# Patient Record
Sex: Male | Born: 1970 | Race: White | Hispanic: No | Marital: Single | Smoking: Never smoker
Health system: Southern US, Community
[De-identification: ages and names within clinical notes are randomized; demographics above are authoritative.]

## PROBLEM LIST (undated history)

## (undated) DIAGNOSIS — K219 Gastro-esophageal reflux disease without esophagitis: Secondary | ICD-10-CM

## (undated) DIAGNOSIS — R55 Syncope and collapse: Secondary | ICD-10-CM

## (undated) DIAGNOSIS — K449 Diaphragmatic hernia without obstruction or gangrene: Secondary | ICD-10-CM

## (undated) DIAGNOSIS — I10 Essential (primary) hypertension: Secondary | ICD-10-CM

## (undated) HISTORY — DX: Syncope and collapse: R55

## (undated) HISTORY — DX: Essential (primary) hypertension: I10

## (undated) HISTORY — PX: APPENDECTOMY: SHX54

## (undated) HISTORY — DX: Diaphragmatic hernia without obstruction or gangrene: K44.9

## (undated) HISTORY — DX: Gastro-esophageal reflux disease without esophagitis: K21.9

---

## 2004-11-30 ENCOUNTER — Encounter: Payer: Self-pay | Admitting: Family Medicine

## 2008-09-23 ENCOUNTER — Ambulatory Visit: Payer: Self-pay | Admitting: Family Medicine

## 2008-09-23 DIAGNOSIS — K219 Gastro-esophageal reflux disease without esophagitis: Secondary | ICD-10-CM | POA: Insufficient documentation

## 2008-09-23 DIAGNOSIS — K449 Diaphragmatic hernia without obstruction or gangrene: Secondary | ICD-10-CM | POA: Insufficient documentation

## 2008-09-23 DIAGNOSIS — J45909 Unspecified asthma, uncomplicated: Secondary | ICD-10-CM | POA: Insufficient documentation

## 2009-09-07 ENCOUNTER — Ambulatory Visit: Payer: Self-pay | Admitting: Family Medicine

## 2009-09-07 DIAGNOSIS — I10 Essential (primary) hypertension: Secondary | ICD-10-CM | POA: Insufficient documentation

## 2009-09-07 HISTORY — DX: Essential (primary) hypertension: I10

## 2010-07-31 ENCOUNTER — Ambulatory Visit: Payer: Self-pay | Admitting: Family Medicine

## 2010-08-22 ENCOUNTER — Ambulatory Visit
Admission: RE | Admit: 2010-08-22 | Discharge: 2010-08-22 | Payer: Self-pay | Source: Home / Self Care | Attending: Family Medicine | Admitting: Family Medicine

## 2010-09-25 NOTE — Assessment & Plan Note (Signed)
Summary: cough/med refill/rbh   Vital Signs:  Patient profile:   40 year old male Height:      68.5 inches Weight:      187.8 pounds BMI:     28.24 Temp:     97.9 degrees F oral Pulse rate:   60 / minute Pulse rhythm:   regular BP sitting:   140 / 94  (left arm) Cuff size:   regular  Vitals Entered By: Benny Lennert CMA Duncan Dull) (September 07, 2009 9:44 AM)  History of Present Illness: Chief complaint cough and medication refills  40 year old:  BP elevated: blood pressure is elevated today, typically does not. He is acutely sick and coughing  Cough: status post URI, he is having some continued cough, patient does have a remote history of mild asthma. He has noticed some occasional wheezing, and he does not have an albuterol inhaler currently. He is in no respiratory distress but does have a dry hacking cough.  GERD: stable and protonic, needs refills. Failed multiple other medications.  Got a bad cold. and  Has had some mild wheezing.     Allergies (verified): No Known Drug Allergies  Past History:  Past medical, surgical, family and social histories (including risk factors) reviewed, and no changes noted (except as noted below).  Past Medical History: Reviewed history from 09/23/2008 and no changes required. Asthma GERD Hiatal hernia  Past Surgical History: Reviewed history from 10/31/2008 and no changes required. Appendectomy  MRI/MRA head, Neg, 07/16/06 EGD: Gastritis, esophagitis, 5/06 Exercise echo, normal, 5/06  Family History: Reviewed history from 09/23/2008 and no changes required. M, alive F, alive, HTN Colon CA, GF, 83  Social History: Reviewed history from 09/23/2008 and no changes required. 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 CV: No chest pain or SOB GI: No noted N or V Otherwise, pertinent positives  and negatives are noted in the HPI.   Physical Exam  Additional Exam:  GEN: A and O x 3. WDWN. NAD.    ENT: Nose clear, ext NML.  No LAD.  No JVD.  TM's clear. Oropharynx clear.  PULM: Normal WOB, no distress. No crackles, wheezes, rhonchi. CV: RRR, no M/G/R, No rubs, No JVD.   ABD: S, NT, ND, + BS. No rebound. No guarding. No HSM.   EXT: warm and well-perfused, No c/c/e. PSYCH: Pleasant and conversant.    Impression & Recommendations:  Problem # 1:  BRONCHITIS- ACUTE (ICD-466.0) Assessment New reason for cover for atypical infection in this case with an asthmatic. Additionally, Ventolin as needed. Cough medicines and supportive care.  His updated medication list for this problem includes:    Ventolin Hfa 108 (90 Base) Mcg/act Aers (Albuterol sulfate) .Marland Kitchen... 2 puff q 4 hour as needed cough / wheezing    Azithromycin 250 Mg Tabs (Azithromycin) .Marland Kitchen... 2 by  mouth today and then 1 daily for 4 days  Problem # 2:  ASTHMA (ICD-493.90) Assessment: Deteriorated  His updated medication list for this problem includes:    Ventolin Hfa 108 (90 Base) Mcg/act Aers (Albuterol sulfate) .Marland Kitchen... 2 puff q 4 hour as needed cough / wheezing  Problem # 3:  GERD (ICD-530.81) Assessment: Unchanged  The following medications were removed from the medication list:    Protonix 40 Mg Tbec (Pantoprazole sodium) .Marland Kitchen... 1 by mouth daily His updated medication list for this problem  includes:    Protonix 40 Mg Tbec (Pantoprazole sodium) .Marland Kitchen... Take one tablet once daily (failed prilosec, aciphex)  Problem # 4:  ELEVATED BLOOD PRESSURE (ICD-796.2) Assessment: New at this point, would follow, as the patient to recheck when he is healthy.  Complete Medication List: 1)  Protonix 40 Mg Tbec (Pantoprazole sodium) .... Take one tablet once daily (failed prilosec, aciphex) 2)  Ventolin Hfa 108 (90 Base) Mcg/act Aers (Albuterol sulfate) .... 2 puff q 4 hour as needed cough / wheezing 3)  Azithromycin 250 Mg Tabs  (Azithromycin) .... 2 by  mouth today and then 1 daily for 4 days Prescriptions: PROTONIX 40 MG TBEC (PANTOPRAZOLE SODIUM) take one tablet once daily (failed Prilosec, Aciphex)  #90 x 3   Entered and Authorized by:   Hannah Beat MD   Signed by:   Hannah Beat MD on 09/07/2009   Method used:   Print then Give to Patient   RxID:   [id number] AZITHROMYCIN 250 MG  TABS (AZITHROMYCIN) 2 by  mouth today and then 1 daily for 4 days  #6 x 0   Entered and Authorized by:   Hannah Beat MD   Signed by:   Hannah Beat MD on 09/07/2009   Method used:   [facility]        [facility] [id number]       [address]       [place], [place]  [address]       Ph: [phone] or [phone]       Fax: [phone]   RxID:   [id number] VENTOLIN HFA 108 (90 BASE) MCG/ACT AERS (ALBUTEROL SULFATE) 2 puff q 4 hour as needed cough / wheezing  #1 x 3   Entered and Authorized by:   Hannah Beat MD   Signed by:   Hannah Beat MD on 09/07/2009   Method used:   [facility]        [facility] (retail)       [address]       [place], [place]  [address]       Ph: [phone] or [phone]       Fax: [phone]   RxID:   [id number]   Current Allergies (reviewed today): No known allergies

## 2010-09-25 NOTE — Assessment & Plan Note (Signed)
Summary: sinus infection/alc   Vital Signs:  Patient profile:   40 year old male Weight:      186 pounds BMI:     27.97 O2 Sat:      97 % on Room air Temp:     98.7 degrees F oral Pulse rate:   80 / minute Pulse rhythm:   regular BP sitting:   136 / 90  (left arm) Cuff size:   large  Vitals Entered By: Sydell Axon LPN (July 31, 2010 2:03 PM)  O2 Flow:  Room air CC: Getting over a cold, dry cough   History of Present Illness: Had cold a few weeks ago.  Cough persists after the cold- this is typical for patient.  Out of inhalers.  Needs new meds.  Dry cough.  H/o asthma that only flares after URI. No FCNAVD.  no sputum.  Wheeze, worse at night.  Prev on flovent and proventil.  Nonsmoker.    Allergies (verified): 1)  ! * Bee Sting  Past History:  Past Medical History: Last updated: 09/23/2008 Asthma GERD Hiatal hernia  Review of Systems       See HPI.  Otherwise negative.    Physical Exam  General:  GEN: nad, alert and oriented HEENT: mucous membranes moist, TM wnl, OP wnl NECK: supple w/o LA CV: rrr. PULM: dry cough but ctab, no inc wob ABD: soft, +bs EXT: no edema    Impression & Recommendations:  Problem # 1:  ASTHMA (ICD-493.90) flu shot encouraged, declined.  Likely flare after URI.  Start back on inhaled SABA and steroids.  Both given as samples.  Both used here in clinic with usualy instructions, good form with use.  Dec in cough as SABA.  Told patient to rinse mouth after steroids.  Will use meds in meantime and follow up as needed.  He may not need chronic meds for this.  He agrees with plan.  His updated medication list for this problem includes:    Ventolin Hfa 108 (90 Base) Mcg/act Aers (Albuterol sulfate) .Marland Kitchen... 2 puff q 4 hour as needed cough / wheezing    Flovent Diskus 100 Mcg/blist Aepb (Fluticasone propionate (inhal)) .Marland Kitchen... 1 puff two times a day  Complete Medication List: 1)  Protonix 40 Mg Tbec (Pantoprazole sodium) .... Take one tablet  once daily (failed prilosec, aciphex) 2)  Ventolin Hfa 108 (90 Base) Mcg/act Aers (Albuterol sulfate) .... 2 puff q 4 hour as needed cough / wheezing 3)  Flovent Diskus 100 Mcg/blist Aepb (Fluticasone propionate (inhal)) .Marland Kitchen.. 1 puff two times a day 4)  Epipen 2-pak 0.3 Mg/0.43ml Devi (Epinephrine) .... Inject as directed if stung by bee then proceed to er  Patient Instructions: 1)  I sent your epipen to the pharmacy.  Use the orange inhaler (flovent) 1 puff two times a day and rinse your mouth after using.  Use the blue ventolin 2 puffs every 4 hours as needed for cough and wheeze.  I would get a flu shot each fall.  If you have other concerns, let us know.  Take care.  Prescriptions: EPIPEN 2-PAK 0.3 MG/0.3ML DEVI (EPINEPHRINE) inject as directed if stung by bee then proceed to ER  #1 pack x 1   Entered and Authorized by:   Crawford Givens MD   Signed by:   Crawford Givens MD on 07/31/2010   Method used:   [facility]        [facility] [phone]  [address]       [place], [place]  [address]       Ph: [phone] or [phone]       Fax: [phone]   RxID:   [id number]    Orders Added: 1)  Est. Patient Level III [[address]]    Current Allergies (reviewed today): ! * BEE STING

## 2010-09-27 NOTE — Assessment & Plan Note (Signed)
Summary: F/U COUGH/CLE   Vital Signs:  Patient profile:   40 year old male Height:      68.5 inches Weight:      183.50 pounds BMI:     27.59 Temp:     98.9 degrees F oral Pulse rate:   80 / minute Pulse rhythm:   regular BP sitting:   142 / 98  (left arm) Cuff size:   large  Vitals Entered By: Delilah Shan CMA Duncan Dull) (August 22, 2010 4:05 PM) CC: F/U cough   History of Present Illness: Some help with SABA- cough persisted but he felt "more open and with better breathing".  Still on the flovent.  He usually has a cough after the URI.  No smoking.  No FCNAVD.  No sputum.  Occ wheeze, variable and worse if inside and at night.  Does better on the days with warmer weather.  Prev winters have had similar episode that would eventually resolve in spring.   Allergies: 1)  ! * Bee Sting  Review of Systems       See HPI.  Otherwise negative.    Physical Exam  General:  GEN: nad, alert and oriented HEENT: mucous membranes moist, TM wnl, OP wnl NECK: supple w/o LA CV: rrr. PULM: dry cough but ctab, no inc wob ABD: soft, +bs EXT: no edema   Impression & Recommendations:  Problem # 1:  ASTHMA (ICD-493.90) We talked about options.  No indication for imaging.  I would increase the ICH and use SABA as needed- SABA sample given.  Call back if not improved and consider switch to ICH HFA.  He agrees.  Nontoxic.  His updated medication list for this problem includes:    Ventolin Hfa 108 (90 Base) Mcg/act Aers (Albuterol sulfate) .Marland Kitchen... 2 puff q 4 hour as needed cough / wheezing    Flovent Diskus 100 Mcg/blist Aepb (Fluticasone propionate (inhal)) .Marland Kitchen... 2 puffs two times a day  Complete Medication List: 1)  Protonix 40 Mg Tbec (Pantoprazole sodium) .... Take one tablet once daily (failed prilosec, aciphex) 2)  Ventolin Hfa 108 (90 Base) Mcg/act Aers (Albuterol sulfate) .... 2 puff q 4 hour as needed cough / wheezing 3)  Flovent Diskus 100 Mcg/blist Aepb (Fluticasone propionate  (inhal)) .... 2 puffs two times a day 4)  Epipen 2-pak 0.3 Mg/0.41ml Devi (Epinephrine) .... Inject as directed if stung by bee then proceed to er  Patient Instructions: 1)  Increase the flovent to 2 puffs two times a day and use the ventolin as needed.  Call me at the end of next week with an update, sooner if needed.  We can consider changing to the [facility] version of flovent if needed (or other meds).  Take care.   Orders Added: 1)  Est. Patient Level III [[address]]    Current Allergies (reviewed today): ! * BEE STING

## 2010-11-01 ENCOUNTER — Encounter: Payer: Self-pay | Admitting: Family Medicine

## 2010-11-01 ENCOUNTER — Ambulatory Visit (INDEPENDENT_AMBULATORY_CARE_PROVIDER_SITE_OTHER): Payer: Managed Care, Other (non HMO) | Admitting: Family Medicine

## 2010-11-01 DIAGNOSIS — J45909 Unspecified asthma, uncomplicated: Secondary | ICD-10-CM

## 2010-11-01 DIAGNOSIS — K219 Gastro-esophageal reflux disease without esophagitis: Secondary | ICD-10-CM

## 2010-11-06 NOTE — Assessment & Plan Note (Signed)
Summary: DISCUSS MEDICATION/CLE  [employer]   Vital Signs:  Patient profile:   40 year old male Height:      68.5 inches Weight:      183.50 pounds BMI:     27.59 Temp:     98.7 degrees F oral Pulse rate:   80 / minute Pulse rhythm:   regular BP sitting:   120 / 84  (left arm) Cuff size:   large  Vitals Entered By: Benny Lennert CMA Duncan Dull) (November 01, 2010 12:00 PM)  History of Present Illness: Chief complaint refill medication  40 year old:  looking into working at [employer] in [place]  GERD, needs protonix taking, with good success, no reflux, no heartburn, no pain.  Asthma: asymptomatic, improved with no wheezing, d/c his flovent. No albuterol use recently.  REVIEW OF SYSTEMS GEN: No acute illnesses, no fever, chills, sweats. CV: No chest pain or SOB GI: No noted N or V Otherwise, pertinent positives and negatives are noted in the HPI.   GEN: WDWN, NAD, Non-toxic, A & O x 3 HEENT: Atraumatic, Normocephalic. Neck supple. No masses, No LAD. Ears and Nose: No external deformity. CV: RRR, No M/G/R. No JVD. No thrill. No extra heart sounds. PULM: CTA B, no wheezes, crackles, rhonchi. No retractions. No resp. distress. No accessory muscle use. EXTR: No c/c/e NEURO: Normal gait.  PSYCH: Normally interactive. Conversant. Not depressed or anxious appearing.  Calm demeanor.    Allergies: 1)  ! * Bee Sting  Past History:  Past medical, surgical, family and social histories (including risk factors) reviewed, and no changes noted (except as noted below).  Past Medical History: Reviewed history from 09/23/2008 and no changes required. Asthma GERD Hiatal hernia  Past Surgical History: Reviewed history from 10/31/2008 and no changes required. Appendectomy  MRI/MRA head, Neg, 07/16/06 EGD: Gastritis, esophagitis, 5/06 Exercise echo, normal, 5/06  Family History: Reviewed history from 09/23/2008 and no changes required. M, alive F, alive, HTN Colon CA, GF, 37  Social  History: Reviewed history from 09/23/2008 and no changes required. 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 Take one tablet once daily (failed prilosec, aciphex) His updated medication list for this problem includes:    Pantoprazole Sodium 40 Mg Tbec (Pantoprazole sodium) .Marland Kitchen... 1 by mouth 30 minutes before breakfast (failed prilosec, aciphex)  Problem # 2:  ASTHMA (ICD-493.90) Assessment: Improved  The following medications were removed from the medication list:    Flovent Diskus 100 Mcg/blist Aepb (Fluticasone propionate (inhal)) .Marland Kitchen... 2 puffs two times a day His updated medication list for this problem includes:    Ventolin Hfa 108 (90 Base) Mcg/act Aers (Albuterol sulfate) .Marland Kitchen... 2 puff q 4 hour as needed cough / wheezing  Complete Medication List: 1)  Ventolin Hfa 108 (90 Base) Mcg/act Aers (Albuterol sulfate) .... 2 puff q 4 hour as needed cough / wheezing 2)  Pantoprazole Sodium 40 Mg Tbec (Pantoprazole sodium) .Marland Kitchen.. 1 by mouth 30 minutes before breakfast (failed prilosec, aciphex) Prescriptions: PANTOPRAZOLE SODIUM 40 MG TBEC (PANTOPRAZOLE SODIUM) 1 by mouth 30 minutes before breakfast (failed prilosec, aciphex)  #90 x 3   Entered and Authorized by:   Hannah Beat MD   Signed by:   Hannah Beat MD on 11/01/2010   Method used:  [facility]        [facility]  [address] [id number]       [address]       [place], [place]  [address]       Ph: [phone] or [phone]       Fax: [phone]   RxID:   [id number]    Orders Added: 1)  Est. Patient Level III [[address]]    Current Allergies (reviewed today): ! * BEE STING

## 2010-12-17 ENCOUNTER — Other Ambulatory Visit: Payer: Self-pay | Admitting: *Deleted

## 2010-12-19 MED ORDER — PANTOPRAZOLE SODIUM 40 MG PO TBEC: 40.0000 mg | DELAYED_RELEASE_TABLET | Freq: Every day | ORAL | Status: DC

## 2010-12-21 NOTE — Telephone Encounter (Signed)
Patient advised rx ready for pick up 

## 2011-05-20 ENCOUNTER — Encounter: Payer: Self-pay | Admitting: Family Medicine

## 2011-05-20 ENCOUNTER — Ambulatory Visit (INDEPENDENT_AMBULATORY_CARE_PROVIDER_SITE_OTHER): Payer: 59 | Admitting: Family Medicine

## 2011-05-20 VITALS — BP 120/90 | HR 55 | Temp 98.7°F | Ht 69.0 in | Wt 190.1 lb

## 2011-05-20 DIAGNOSIS — M549 Dorsalgia, unspecified: Secondary | ICD-10-CM

## 2011-05-20 MED ORDER — DICLOFENAC SODIUM 75 MG PO TBEC: 75.0000 mg | DELAYED_RELEASE_TABLET | Freq: Two times a day (BID) | ORAL | Status: AC

## 2011-05-20 MED ORDER — TIZANIDINE HCL 4 MG PO TABS: 4.0000 mg | ORAL_TABLET | Freq: Every evening | ORAL | Status: AC

## 2011-05-20 MED ORDER — TRAMADOL HCL 50 MG PO TABS: 50.0000 mg | ORAL_TABLET | Freq: Four times a day (QID) | ORAL | Status: AC | PRN

## 2011-05-20 NOTE — Progress Notes (Signed)
  Subjective:    Patient ID: Vincent Winters, male    DOB: August 12, 1971, 40 y.o.   MRN: [id number]  HPI  Vincent Winters, a 40 y.o. male presents today in the office for the following:    Weedeating, then was going to level the stump. Kneeled down on his knees and hurting ever since. 5-6/10. If siting for a long time, hjurts. Walking it feels better.   The above patient presents with back pain that has been ongoing for approximately: yest The patient has had back pain before, but none significant The back pain is localized into the lumbar spine area. They also describe no radiculopathy.  No numbness or tingling. No bowel or bladder incontinence. No focal weakness. Prior interventions: motrin, no relief Physical therapy: No Chiropractic manipulations: No Acupuncture: No Osteopathic manipulation: No Heat or cold: Minimal effect  REVIEW OF SYSTEMS  GEN: No fevers, chills. Nontoxic. Primarily MSK c/o today. MSK: Detailed in the HPI GI: tolerating PO intake without difficulty Neuro: As above  Otherwise the pertinent positives of the ROS are noted above.    The PMH, PSH, Social History, Family History, Medications, and allergies have been reviewed in [facility], and have been updated if relevant.  PHYSICAL EXAM  Blood pressure 120/90, pulse 55, temperature 98.7 F (37.1 C), temperature source Oral, height 5\' 9"  (1.753 m), weight 190 lb 1.9 oz (86.238 kg), SpO2 98.00%.  Gen: Well-developed,well-nourished,in no acute distress; alert,appropriate and cooperative throughout examination HEENT: Normocephalic and atraumatic without obvious abnormalities.  Ears, externally no deformities Pulm: Breathing comfortably in no respiratory distress Range of motion at  the waist: Flexion: nml Extension: nml Rotation: nml  No echymosis or edema Rises to examination table with no difficulty Gait: minimally antalgic  Inspection/Deformity: N Paraspinus T: Minimally antalgic B around L4-5  B Ankle  Dorsiflexion (L5,4): 5/5 B Great Toe Dorsiflexion (L5,4): 5/5 Heel Walk (L5): WNL Toe Walk (S1): WNL Rise/Squat (L4): WNL  SENSORY B Medial Foot (L4): WNL B Dorsum (L5): WNL B Lateral (S1): WNL Light Touch: WNL Pinprick: WNL  REFLEXES Knee (L4): 2+ Ankle (S1): 2+  B SLR, seated: neg B SLR, supine: neg B FABER: neg B Reverse FABER: neg B Greater Troch: NT B Log Roll: neg B Stork: NT B Sciatic Notch: NT Leg Lengths: equal    Assessment and plan: Lumbar back pain  I reviewed with the patient the structures involved and how they related to their diagnosis.   Conservative algorithms for acute back pain generally begin with the following: NSAIDS Muscle Relaxants Mild pain medication  Start with medications, core rehab, and progress from there following low back pain algorithm.  No red flags are present.  Review of Systems     Objective:   Physical Exam        Assessment & Plan:

## 2012-07-20 ENCOUNTER — Encounter: Payer: Self-pay | Admitting: Family Medicine

## 2012-07-20 ENCOUNTER — Ambulatory Visit (INDEPENDENT_AMBULATORY_CARE_PROVIDER_SITE_OTHER): Payer: 59 | Admitting: Family Medicine

## 2012-07-20 VITALS — BP 150/92 | HR 78 | Temp 98.6°F | Ht 69.0 in | Wt 192.2 lb

## 2012-07-20 DIAGNOSIS — J45909 Unspecified asthma, uncomplicated: Secondary | ICD-10-CM

## 2012-07-20 MED ORDER — FLUTICASONE PROPIONATE HFA 110 MCG/ACT IN AERO: 1.0000 | INHALATION_SPRAY | Freq: Two times a day (BID) | RESPIRATORY_TRACT | Status: DC

## 2012-07-20 MED ORDER — ALBUTEROL SULFATE HFA 108 (90 BASE) MCG/ACT IN AERS: 2.0000 | INHALATION_SPRAY | Freq: Four times a day (QID) | RESPIRATORY_TRACT | Status: DC | PRN

## 2012-07-20 NOTE — Progress Notes (Signed)
Nature conservation officer at [facility] [address] [place] [address] Phone: [phone] Fax: [phone]  Date:  07/20/2012   Name:  Vincent Winters   DOB:  [birth year]   MRN:  [id number] Gender: male Age: 41 y.o.  PCP:  Hannah Beat, MD  Evaluating MD: Hannah Beat, MD   Chief Complaint: Medication Refill   History of Present Illness:  Vincent Winters is a 41 y.o. pleasant patient who presents with the following:  130/85 on my recheck.  Recently had a uri, and now he is having some cough and occ wheezing at night. Feels ok from an infection standpoint except for the cough.   Patient Active Problem List  Diagnosis  . ASTHMA  . GERD  . HIATAL HERNIA  . ELEVATED BLOOD PRESSURE    Past Medical History  Diagnosis Date  . Asthma   . GERD (gastroesophageal reflux disease)   . Hiatal hernia     Past Surgical History  Procedure Date  . Appendectomy     History  Substance Use Topics  . Smoking status: Unknown If Ever Smoked  . Smokeless tobacco: Not on file  . Alcohol Use: Yes    Family History  Problem Relation Age of Onset  . Hypertension Father     No Known Allergies  Medication list has been reviewed and updated.  Outpatient Prescriptions Prior to Visit  Medication Sig Dispense Refill  . ibuprofen (ADVIL,MOTRIN) 200 MG tablet Take 400 mg by mouth every 6 (six) hours as needed.        Marland Kitchen omeprazole (PRILOSEC) 20 MG capsule Take 20 mg by mouth daily.         Last reviewed on 07/20/2012  3:45 PM by Consuello Masse, CMA  Review of Systems:  ROS: GEN: Acute illness details above GI: Tolerating PO intake GU: maintaining adequate hydration and urination Pulm: No SOB Interactive and getting along well at home.  Otherwise, ROS is as per the HPI.   Physical Examination: Filed Vitals:   07/20/12 1539  BP: 150/92  Pulse: 78  Temp: 98.6 F (37 C)  TempSrc: Oral  Height: 5\' 9"  (1.753 m)  Weight: 192 lb 4 oz (87.204 kg)  SpO2: 97%     Body mass index is 28.39 kg/(m^2). Ideal Body Weight: Weight in (lb) to have BMI = 25: 168.9    GEN: A and O x 3. WDWN. NAD.    ENT: Nose clear, ext NML.  No LAD.  No JVD.  TM's clear. Oropharynx clear.  PULM: Normal WOB, no distress. No crackles, wheezes, rhonchi. CV: RRR, no M/G/R, No rubs, No JVD.   EXT: warm and well-perfused, No c/c/e. PSYCH: Pleasant and conversant.   Assessment and Plan:  1. ASTHMA    Refill asthma meds Reassurance  bp normal on recheck  Orders Today:  No orders of the defined types were placed in this encounter.    Updated Medication List: (Includes new medications, updates to list, dose adjustments) Meds ordered this encounter  Medications  . DISCONTD: albuterol (PROVENTIL HFA;VENTOLIN HFA) 108 (90 BASE) MCG/ACT inhaler    Sig: Inhale 2 puffs into the lungs every 6 (six) hours as needed.  Marland Kitchen DISCONTD: fluticasone (FLOVENT HFA) 110 MCG/ACT inhaler    Sig: Inhale 1 puff into the lungs 2 (two) times daily.  . fluticasone (FLOVENT HFA) 110 MCG/ACT inhaler    Sig: Inhale 1 puff into the lungs 2 (two) times daily.    Dispense:  1 Inhaler  Refill:  11  . albuterol (PROVENTIL HFA;VENTOLIN HFA) 108 (90 BASE) MCG/ACT inhaler    Sig: Inhale 2 puffs into the lungs every 6 (six) hours as needed.    Dispense:  1 Inhaler    Refill:  4    Medications Discontinued: Medications Discontinued During This Encounter  Medication Reason  . fluticasone (FLOVENT HFA) 110 MCG/ACT inhaler Reorder  . albuterol (PROVENTIL HFA;VENTOLIN HFA) 108 (90 BASE) MCG/ACT inhaler Reorder     Hannah Beat, MD

## 2013-03-17 ENCOUNTER — Encounter: Payer: Self-pay | Admitting: Family Medicine

## 2013-03-17 ENCOUNTER — Ambulatory Visit (INDEPENDENT_AMBULATORY_CARE_PROVIDER_SITE_OTHER): Payer: 59 | Admitting: Family Medicine

## 2013-03-17 VITALS — BP 120/78 | HR 73 | Temp 98.7°F | Ht 69.0 in | Wt 193.0 lb

## 2013-03-17 DIAGNOSIS — M5010 Cervical disc disorder with radiculopathy, unspecified cervical region: Secondary | ICD-10-CM

## 2013-03-17 DIAGNOSIS — M5412 Radiculopathy, cervical region: Secondary | ICD-10-CM

## 2013-03-17 DIAGNOSIS — M25512 Pain in left shoulder: Secondary | ICD-10-CM

## 2013-03-17 DIAGNOSIS — M25519 Pain in unspecified shoulder: Secondary | ICD-10-CM

## 2013-03-17 NOTE — Progress Notes (Signed)
Nature conservation officer at [facility] [address] [place] [address] Phone: [phone] Fax: [phone]  Date:  03/17/2013   Name:  Vincent Winters   DOB:  June 23, 1971   MRN:  [id number] Gender: male Age: 42 y.o.  Primary Physician:  Hannah Beat, MD  Evaluating MD: Hannah Beat, MD   Chief Complaint: Shoulder Pain   History of Present Illness:  Vincent Winters is a 43 y.o. pleasant patient who presents with the following:  L shoulder Comes and goes, building a ramp with some pavers. That was about 6 weeks.  ? Tingling down arm. Some posterior shoulder pain.  This aafternoon it was bad some.  Will come and go. Building ramp only. Some tingling down arm. Some posterior shoulder blade pain. No pain with abduction.  Patient Active Problem List   Diagnosis Date Noted  . ELEVATED BLOOD PRESSURE 09/07/2009  . ASTHMA 09/23/2008  . GERD 09/23/2008  . HIATAL HERNIA 09/23/2008    Past Medical History  Diagnosis Date  . Asthma   . GERD (gastroesophageal reflux disease)   . Hiatal hernia     Past Surgical History  Procedure Laterality Date  . Appendectomy      History   Social History  . Marital Status: Single    Spouse Name: N/A    Number of Children: N/A  . 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 Smoking status: Unknown If Ever Smoked  . Smokeless tobacco: Not on file  . Alcohol Use: Yes  . Drug Use: No  . Sexually Active: Not on file   Other Topics Concern  . Not on file   Social History Narrative   Regular exercise-yes      From [place]    Family History  Problem Relation Age of Onset  . Hypertension Father     No Known Allergies  Medication list has been reviewed and updated.  Outpatient Prescriptions Prior to Visit  Medication Sig Dispense Refill  . albuterol (PROVENTIL HFA;VENTOLIN HFA) 108 (90 BASE) MCG/ACT inhaler Inhale 2 puffs into the lungs every 6 (six) hours as  needed.  1 Inhaler  4  . fluticasone (FLOVENT HFA) 110 MCG/ACT inhaler Inhale 1 puff into the lungs 2 (two) times daily.  1 Inhaler  11  . ibuprofen (ADVIL,MOTRIN) 200 MG tablet Take 400 mg by mouth every 6 (six) hours as needed.        Marland Kitchen omeprazole (PRILOSEC) 20 MG capsule Take 20 mg by mouth daily.         No facility-administered medications prior to visit.    Review of Systems:   GEN: No fevers, chills. Nontoxic. Primarily MSK c/o today. MSK: Detailed in the HPI GI: tolerating PO intake without difficulty Neuro: No numbness, parasthesias, or tingling associated. Otherwise the pertinent positives of the ROS are noted above.    Physical Examination: BP 120/78  Pulse 73  Temp(Src) 98.7 F (37.1 C) (Oral)  Ht 5\' 9"  (1.753 m)  Wt 193 lb (87.544 kg)  BMI 28.49 kg/m2  SpO2 99%  Ideal Body Weight: Weight in (lb) to have BMI = 25: 168.9   GEN: Well-developed,well-nourished,in no acute distress; alert,appropriate and cooperative throughout examination HEENT: Normocephalic and atraumatic without obvious abnormalities. Ears, externally no deformities PULM: Breathing comfortably in no respiratory distress EXT: No clubbing, cyanosis, or edema PSYCH: Normally interactive. Cooperative during the interview. Pleasant. Friendly and conversant. Not anxious or depressed  appearing. Normal, full affect.  CERVICAL SPINE EXAM Range of motion: Flexion, extension, lateral bending, and rotation: mild restriction Pain with terminal motion: minimal Spinous Processes: NT SCM: NT Upper paracervical muscles: ttp on L Upper traps: TTP on L C5-T1 intact, sensation and motor  Shoulder: L Inspection: No muscle wasting or winging Ecchymosis/edema: neg  AC joint, scapula, clavicle: NT Cervical spine: NT, full ROM Spurling's: neg Abduction: full, 5/5 Flexion: full, 5/5 IR, full, lift-off: 5/5 - mild pain at lift off ER at neutral: full, 5/5 AC crossover and compression: neg Neer: neg Hawkins:  neg Drop Test: neg Empty Can: neg Supraspinatus insertion: NT Bicipital groove: NT Speed's: neg Yergason's: neg Sulcus sign: neg Scapular dyskinesis: none C5-T1 intact Sensation intact Grip 5/5   Assessment and Plan:  Cervical disc disorder with radiculopathy of cervical region  Pain in joint, shoulder region, left  Intermittent radicular pain Minimal subcoracoid impingement  Given neck and shoulder rehab Cont nsaids, 2-3 weeks  Orders Today:  No orders of the defined types were placed in this encounter.    Updated Medication List: (Includes new medications, updates to list, dose adjustments) No orders of the defined types were placed in this encounter.    Medications Discontinued: There are no discontinued medications.    Signed, Elpidio Galea. Braysen Cloward, MD 03/17/2013 4:17 PM

## 2013-05-13 ENCOUNTER — Encounter: Payer: Self-pay | Admitting: Family Medicine

## 2013-05-13 ENCOUNTER — Ambulatory Visit (INDEPENDENT_AMBULATORY_CARE_PROVIDER_SITE_OTHER): Payer: 59 | Admitting: Family Medicine

## 2013-05-13 VITALS — BP 120/84 | HR 65 | Temp 98.4°F | Ht 69.0 in | Wt 194.2 lb

## 2013-05-13 DIAGNOSIS — H9201 Otalgia, right ear: Secondary | ICD-10-CM

## 2013-05-13 DIAGNOSIS — H9209 Otalgia, unspecified ear: Secondary | ICD-10-CM

## 2013-05-13 NOTE — Progress Notes (Signed)
   Nature conservation officer at [facility] [address] [place] [address] Phone: [phone] Fax: [phone]  Date:  05/13/2013   Name:  Vincent Winters   DOB:  [birth year]   MRN:  [id number] Gender: male Age: 42 y.o.  Primary Physician:  Hannah Beat, MD  Evaluating MD: Hannah Beat, MD  Chief Complaint: Otalgia   History of Present Illness:  Vincent Winters is a 42 y.o. very pleasant male patient who presents with the following:  Right ear pain, was plugging up and went to his nurse at work. He is tried fix with a qtip, but has continued to hurt a lot.  Past Medical History, Surgical History, Social History, Family History, Problem List, Medications, and Allergies have been reviewed and updated if relevant.  Current Outpatient Prescriptions on File Prior to Visit  Medication Sig Dispense Refill  . albuterol (PROVENTIL HFA;VENTOLIN HFA) 108 (90 BASE) MCG/ACT inhaler Inhale 2 puffs into the lungs every 6 (six) hours as needed.  1 Inhaler  4  . fluticasone (FLOVENT HFA) 110 MCG/ACT inhaler Inhale 1 puff into the lungs 2 (two) times daily.  1 Inhaler  11  . ibuprofen (ADVIL,MOTRIN) 200 MG tablet Take 400 mg by mouth every 6 (six) hours as needed.        Marland Kitchen omeprazole (PRILOSEC) 20 MG capsule Take 20 mg by mouth daily.         No current facility-administered medications on file prior to visit.    Review of Systems:  GEN: No acute illnesses, no fevers, chills. GI: No n/v/d, eating normally Pulm: No SOB Interactive and getting along well at home.  Otherwise, ROS is as per the HPI.   Physical Examination: BP 120/84  Pulse 65  Temp(Src) 98.4 F (36.9 C) (Oral)  Ht 5\' 9"  (1.753 m)  Wt 194 lb 4 oz (88.111 kg)  BMI 28.67 kg/m2   GEN: WDWN, NAD, Non-toxic, Alert & Oriented x 3 HEENT: Atraumatic, Normocephalic. Cerumen impaction on the R, when cleared TM is clear, anatomy well seen. Ears and Nose: No external deformity. EXTR: No clubbing/cyanosis/edema NEURO: Normal  gait.  PSYCH: Normally interactive. Conversant. Not depressed or anxious appearing.  Calm demeanor.    Assessment and Plan: Otalgia of right ear   Ear canal irrigated with good success  Signed, Cormick Moss T. Zeniya Lapidus, MD 05/13/2013 5:08 PM

## 2013-10-27 ENCOUNTER — Encounter: Payer: Self-pay | Admitting: Family Medicine

## 2013-10-27 ENCOUNTER — Ambulatory Visit (INDEPENDENT_AMBULATORY_CARE_PROVIDER_SITE_OTHER): Payer: 59 | Admitting: Family Medicine

## 2013-10-27 VITALS — BP 128/80 | HR 84 | Temp 98.3°F | Wt 199.5 lb

## 2013-10-27 DIAGNOSIS — M792 Neuralgia and neuritis, unspecified: Secondary | ICD-10-CM

## 2013-10-27 DIAGNOSIS — J45909 Unspecified asthma, uncomplicated: Secondary | ICD-10-CM

## 2013-10-27 DIAGNOSIS — IMO0002 Reserved for concepts with insufficient information to code with codable children: Secondary | ICD-10-CM

## 2013-10-27 MED ORDER — FLUTICASONE PROPIONATE HFA 110 MCG/ACT IN AERO: 1.0000 | INHALATION_SPRAY | Freq: Two times a day (BID) | RESPIRATORY_TRACT | Status: DC

## 2013-10-27 MED ORDER — ALBUTEROL SULFATE HFA 108 (90 BASE) MCG/ACT IN AERS: 2.0000 | INHALATION_SPRAY | Freq: Four times a day (QID) | RESPIRATORY_TRACT | Status: DC | PRN

## 2013-10-27 NOTE — Progress Notes (Signed)
Date:  10/27/2013   Name:  Vincent SpannerDan Trotta   DOB:  30-Dec-1970   MRN:  [id number]  Primary Physician:  Hannah BeatSpencer Nykeria Mealing, MD   Chief Complaint: Medication Refill and Numbness   Subjective:   History of Present Illness:  Vincent Winters is a 43 y.o. very pleasant male patient who presents with the following:  Anterior arm pain, sometimes on the side, and like he slept on arm wrong. Sometimes shooting pain in the wrist.  Antecubital fossa, and also pain down his arm rarely.   Also with intermittent piriformis syndrome with sitting at work.  Got a cold 3-4 weeks ago, then now having some bronchospasm Out of all asthma meds  Past Medical History, Surgical History, Social History, Family History, Problem List, Medications, and Allergies have been reviewed and updated if relevant.  Review of Systems:  GEN: No acute illnesses, no fevers, chills. GI: No n/v/d, eating normally Pulm: No SOB Interactive and getting along well at home.  Otherwise, ROS is as per the HPI.  Objective:   Physical Examination: BP 128/80  Pulse 84  Temp(Src) 98.3 F (36.8 C) (Oral)  Wt 199 lb 8 oz (90.493 kg)  SpO2 97%   GEN: WDWN, NAD, Non-toxic, A & O x 3 HEENT: Atraumatic, Normocephalic. Neck supple. No masses, No LAD. Ears and Nose: No external deformity. CV: RRR, No M/G/R. No JVD. No thrill. No extra heart sounds. PULM: CTA B, no wheezes, crackles, rhonchi. No retractions. No resp. distress. No accessory muscle use. EXTR: No c/c/e NEURO Normal gait.  PSYCH: Normally interactive. Conversant. Not depressed or anxious appearing.  Calm demeanor.   Shoulder: L Inspection: No muscle wasting or winging Ecchymosis/edema: neg  AC joint, scapula, clavicle: NT Cervical spine: NT, full ROM Spurling's: neg Abduction: full, 5/5 Flexion: full, 5/5 IR, full, lift-off: 5/5 ER at neutral: full, 5/5 AC crossover and compression: neg Neer: neg Hawkins: neg Drop Test: neg Empty Can: neg Supraspinatus  insertion: NT Bicipital groove: NT Speed's: neg Yergason's: neg Sulcus sign: neg Scapular dyskinesis: none C5-T1 intact Sensation intact Grip 5/5   Laboratory and Imaging Data:  Assessment & Plan:   ASTHMA  Radicular pain in left arm  Refill meds, asthma  Reassured, radicular pain, cervical vs. Possibly distal. Intermittent and not bothering him much now  Signed,  Casmere Hollenbeck T. Jarion Hawthorne, MD, CAQ Sports Medicine  [facility] [address] [place] [address] Phone: [phone] Fax: [phone]  There are no Patient Instructions on file for this visit. Patient's Medications  New Prescriptions   No medications on file  Previous Medications   IBUPROFEN (ADVIL,MOTRIN) 200 MG TABLET    Take 400 mg by mouth every 6 (six) hours as needed.     OMEPRAZOLE (PRILOSEC) 20 MG CAPSULE    Take 20 mg by mouth daily.    Modified Medications   Modified Medication Previous Medication   ALBUTEROL (PROVENTIL HFA;VENTOLIN HFA) 108 (90 BASE) MCG/ACT INHALER albuterol (PROVENTIL HFA;VENTOLIN HFA) 108 (90 BASE) MCG/ACT inhaler      Inhale 2 puffs into the lungs every 6 (six) hours as needed.    Inhale 2 puffs into the lungs every 6 (six) hours as needed.   FLUTICASONE (FLOVENT HFA) 110 MCG/ACT INHALER fluticasone (FLOVENT HFA) 110 MCG/ACT inhaler      Inhale 1 puff into the lungs 2 (two) times daily.    Inhale 1 puff into the lungs 2 (two) times daily.  Discontinued Medications   No medications on file

## 2013-10-27 NOTE — Progress Notes (Signed)
Pre visit review using our clinic review tool, if applicable. No additional management support is needed unless otherwise documented below in the visit note. 

## 2013-12-15 ENCOUNTER — Encounter: Payer: Self-pay | Admitting: Family Medicine

## 2013-12-15 ENCOUNTER — Ambulatory Visit (INDEPENDENT_AMBULATORY_CARE_PROVIDER_SITE_OTHER): Payer: 59 | Admitting: Family Medicine

## 2013-12-15 VITALS — BP 120/76 | HR 68 | Temp 98.1°F | Ht 68.75 in | Wt 199.0 lb

## 2013-12-15 DIAGNOSIS — M25511 Pain in right shoulder: Secondary | ICD-10-CM

## 2013-12-15 DIAGNOSIS — M25519 Pain in unspecified shoulder: Secondary | ICD-10-CM

## 2013-12-15 DIAGNOSIS — IMO0002 Reserved for concepts with insufficient information to code with codable children: Secondary | ICD-10-CM

## 2013-12-15 DIAGNOSIS — S46911A Strain of unspecified muscle, fascia and tendon at shoulder and upper arm level, right arm, initial encounter: Secondary | ICD-10-CM

## 2013-12-15 NOTE — Progress Notes (Signed)
[address] [place] [address] Phone: [phone] Fax: [phone]  Patient ID: Vincent SpannerDan Thedford MRN: [id number], DOB: 04-22-1971, 43 y.o. Date of Encounter: 12/15/2013  Primary Physician:  Hannah BeatSpencer Ankita Newcomer, MD   Chief Complaint: Shoulder Pain  Subjective:   History of Present Illness:  This 43 y.o. male patient noted above presents with shoulder pain that has been ongoing for 3 weeks there is no history of trauma or accident recently The patient denies neck pain or radicular symptoms. Denies dislocation, subluxation, separation of the shoulder. The patient does complain of pain in the overhead plane with significant painful arc of motion.  Medications Tried: Tylenol, NSAIDS Ice or Heat: minimally helpful Tried PT: No  Prior shoulder Injury: No Prior surgery: No Prior fracture: No  R shoulder pain:  Several weeks ago, came in the house, heard and felt a pop in the shoulder ever since. Started out more anterior and into the back. Some in a t-shirt distribution.  It was waking him up some in the night. Then roll on back, and it would get better. Still sore with doing things.   Did do some ice.   The PMH, PSH, Social History, Family History, Medications, and allergies [facility], and have been updated if relevant.  REVIEW OF SYSTEMS  GEN: No fevers, chills. Nontoxic. Primarily MSK c/o today. MSK: Detailed in the HPI GI: tolerating PO intake without difficulty Neuro: No numbness, parasthesias, or tingling associated. Otherwise the pertinent positives of the ROS are noted above.   Objective:   PHYSICAL EXAM  Blood pressure 120/76, pulse 68, temperature 98.1 F (36.7 C), temperature source Oral, height 5' 8.75" (1.746 m), weight 199 lb (90.266 kg), SpO2 97.00%. Body mass index is 29.61 kg/(m^2).   GEN: Well-developed,well-nourished,in no acute distress; alert,appropriate and cooperative throughout examination HEENT: Normocephalic and atraumatic  without obvious abnormalities. Ears, externally no deformities PULM: Breathing comfortably in no respiratory distress EXT: No clubbing, cyanosis, or edema PSYCH: Normally interactive. Cooperative during the interview. Pleasant. Friendly and conversant. Not anxious or depressed appearing. Normal, full affect.  Shoulder: R Inspection: No muscle wasting or winging Ecchymosis/edema: neg  AC joint, scapula, clavicle: NT Cervical spine: NT, full ROM Spurling's: neg Abduction: full, 5/5 mild pain with abduction. Flexion: full, 5/5 mild pain with flexion. IR, full, lift-off: 5/5 ER at neutral: full, 5/5 AC crossover: neg Neer: pos, mild Hawkins: pos, mild Drop Test: neg Empty Can: pos Supraspinatus insertion: mild-mod T Bicipital groove: NT Speed's: neg Yergason's: neg Sulcus sign: neg Scapular dyskinesis: none C5-T1 intact  Neuro: Sensation intact Grip 5/5   Radiology: No results found.  Assessment & Plan:   Right shoulder pain  Right shoulder strain   Most consistent with mild muscular injury. Strength is intact. For now, given some basic range of motion, rotator cuff strengthening and scapular stabilization.  I suspect he will improve in short order, and if he is not better in 3 or 4 weeks, asked to come back in for reevaluation.  Signed,   Elpidio GaleaSpencer T. Vandell Kun, MD, CAQ Sports Medicine  Patient's Medications  New Prescriptions   No medications on file  Previous Medications   ALBUTEROL (PROVENTIL HFA;VENTOLIN HFA) 108 (90 BASE) MCG/ACT INHALER    Inhale 2 puffs into the lungs every 6 (six) hours as needed.   FLUTICASONE (FLOVENT HFA) 110 MCG/ACT INHALER    Inhale 1 puff into the lungs 2 (two) times daily.   IBUPROFEN (ADVIL,MOTRIN) 200 MG TABLET    Take  400 mg by mouth every 6 (six) hours as needed.     OMEPRAZOLE (PRILOSEC) 20 MG CAPSULE    Take 20 mg by mouth daily.    Modified Medications   No medications on file  Discontinued Medications   No medications on file

## 2013-12-15 NOTE — Progress Notes (Signed)
Pre visit review using our clinic review tool, if applicable. No additional management support is needed unless otherwise documented below in the visit note. 

## 2014-03-22 ENCOUNTER — Ambulatory Visit (INDEPENDENT_AMBULATORY_CARE_PROVIDER_SITE_OTHER)
Admission: RE | Admit: 2014-03-22 | Discharge: 2014-03-22 | Disposition: A | Discharge status: Home / Self Care | Payer: 59 | Source: Ambulatory Visit | Attending: Family Medicine | Admitting: Family Medicine

## 2014-03-22 ENCOUNTER — Encounter: Payer: Self-pay | Admitting: Family Medicine

## 2014-03-22 ENCOUNTER — Ambulatory Visit (INDEPENDENT_AMBULATORY_CARE_PROVIDER_SITE_OTHER): Payer: 59 | Admitting: Family Medicine

## 2014-03-22 VITALS — BP 130/90 | HR 65 | Temp 97.9°F | Ht 68.75 in | Wt 197.0 lb

## 2014-03-22 DIAGNOSIS — M7501 Adhesive capsulitis of right shoulder: Secondary | ICD-10-CM | POA: Insufficient documentation

## 2014-03-22 DIAGNOSIS — H9319 Tinnitus, unspecified ear: Secondary | ICD-10-CM

## 2014-03-22 DIAGNOSIS — H9313 Tinnitus, bilateral: Secondary | ICD-10-CM

## 2014-03-22 DIAGNOSIS — M7500 Adhesive capsulitis of unspecified shoulder: Secondary | ICD-10-CM

## 2014-03-22 MED ORDER — FLUTICASONE PROPIONATE 50 MCG/ACT NA SUSP: 2.0000 | Freq: Every day | NASAL | Status: DC

## 2014-03-22 NOTE — Progress Notes (Signed)
Pre visit review using our clinic review tool, if applicable. No additional management support is needed unless otherwise documented below in the visit note. 

## 2014-03-22 NOTE — Patient Instructions (Signed)
Eustachian Tube Dysfunction: There is a tube that connects between the sinuses and behind the ear called the "eustachian tube." Sometimes when you have allergies, a cold, or nasal congestion for any reason this tube can get blocked and pressure cannot equalize in your ears. (Like if you swim in deep water) This can also trap fluid behind the ear and give you a full, pressure-like sensation that is uncomfortable, but it is not an ear infection.  Recommendations: Afrin Nasal Spray: 2 sprays twice a day for a maximum of 3-4 days (longer than this and your nose gets addicted, and you have rebound swelling that makes it worse.) Sudafed: Either pseudoephedrine or phenylephrine. As directed on box. (Not is heart problems or high blood pressure) Anti-Histamine: Allegra, Zyrtec, or Claritin. All over the counter now and once a day.  Nasal steroid. Nasacort is over the counter now. About 10 prescription ones exist.  If you develop fever > 100.4, then things can change fluid behind the ear does increase your risk of developing an ear infection.    Tinnitus Sounds you hear in your ears and coming from within the ear is called tinnitus. This can be a symptom of many ear disorders. It is often associated with hearing loss.  Tinnitus can be seen with:  Infections.  Ear blockages such as wax buildup.  Meniere's disease.  Ear damage.  Inherited.  Occupational causes. While irritating, it is not usually a threat to health. When the cause of the tinnitus is wax, infection in the middle ear, or foreign body it is easily treated. Hearing loss will usually be reversible.   TREATMENT  When treating the underlying cause does not get rid of tinnitus, it may be necessary to get rid of the unwanted sound by covering it up with more pleasant background noises. This may include music, the radio etc. There are tinnitus maskers which can be worn which produce background noise to cover up the tinnitus. Avoid all  medications which tend to make tinnitus worse such as alcohol, caffeine, aspirin, and nicotine. There are many soothing background tapes such as rain, ocean, thunderstorms, etc. These soothing sounds help with sleeping or resting. Keep all follow-up appointments and referrals. This is important to identify the cause of the problem. It also helps avoid complications, impaired hearing, disability, or chronic pain. Document Released: 08/12/2005 Document Revised: 11/04/2011 Document Reviewed: 03/30/2008 [facility]ExitCare Patient Information 2015 [employer]. This information is not intended to replace advice given to you by your health care provider. Make sure you discuss any questions you have with your health care provider.

## 2014-03-22 NOTE — Progress Notes (Signed)
[address] [place] [address] Phone: [phone] Fax: [phone]  Patient ID: Vincent Winters MRN: [id number], DOB: 12/08/1970, 43 y.o. Date of Encounter: 03/22/2014  Primary Physician:  Hannah Beat, MD   Chief Complaint: Tinnitus and Shoulder Pain   Subjective:   History of Present Illness:  Vincent Winters is a 43 y.o. very pleasant male patient who presents with the following:  R shoulder. Since May, still significant and lost ROM, T-shirt distribution. Bothering about every day all day long. I saw him a few months ago, and the patient had some acute injury to his shoulder. At that point I recommended he do some range of motion isometric strengthening. Since that time, he has lost range of motion in all directions and he is preserved his strength.  Tinnitus. Constantly ringing. Washing at home. Ringing loud first thing in the morning.   Physical Therapy in [place].    Past Medical History, Surgical History, Social History, Family History, Problem List, Medications, and Allergies have been reviewed and updated if relevant.  Review of Systems:  GEN: No fevers, chills. Nontoxic. Primarily MSK c/o today. MSK: Detailed in the HPI GI: tolerating PO intake without difficulty Neuro: No numbness, parasthesias, or tingling associated. Otherwise the pertinent positives of the ROS are noted above.   Objective:   Physical Examination: BP 130/90  Pulse 65  Temp(Src) 97.9 F (36.6 C) (Oral)  Ht 5' 8.75" (1.746 m)  Wt 197 lb (89.359 kg)  BMI 29.31 kg/m2   GEN: WDWN, NAD, Non-toxic, Alert & Oriented x 3 HEENT: Atraumatic, Normocephalic.  Ears and Nose: No external deformity. EXTR: No clubbing/cyanosis/edema NEURO: Normal gait.  PSYCH: Normally interactive. Conversant. Not depressed or anxious appearing.  Calm demeanor.    Right shoulder is able to abduct to 110. Flexion to 120. Minimal internal or external range of motion as possible. Strength is 5/5.  Other special testing is equivocal.  Radiology: Dg Shoulder Right  03/22/2014   CLINICAL DATA:  Right shoulder pain, frozen shoulder  EXAM: RIGHT SHOULDER - 2+ VIEW  COMPARISON:  None.  FINDINGS: Three views of the right shoulder submitted. Mild degenerative changes right AC joint. Mild narrowing of glenohumeral joint space. No acute fracture or subluxation.  IMPRESSION: No acute fracture or subluxation. Mild narrowing of glenohumeral joint space. Mild degenerative changes right AC joint.   Electronically Signed   By: Natasha Mead M.D.   On: 03/22/2014 09:59    Assessment & Plan:   Adhesive capsulitis of right shoulder - Plan: DG Shoulder Right, Ambulatory referral to Physical Therapy  Tinnitus, bilateral   adhesive capsulitis  Patient was given a systematic ROM protocol from [facility] or [facility] to be done daily. Emphasized adherence and recommended formal PT.  Tylenol or NSAID of choice prn for pain relief  Patient will be sent for formal PT for aggressive frozen shoulder ROM. Will need RTC str and scapular stabilization to fix underlying mechanics.  Intraarticular corticosteroid injections in Adhesive Capsulitis have clearly been shown to be of benefit. He declined.  New Prescriptions   FLUTICASONE (FLONASE) 50 MCG/ACT NASAL SPRAY    Place 2 sprays into both nostrils daily.   Modified Medications   No medications on file   Orders Placed This Encounter  Procedures  . DG Shoulder Right  . Ambulatory referral to Physical Therapy   Follow-up: Return in about 2 months (around 05/23/2014). Unless noted above, the patient is to follow-up if symptoms worsen. Red flags were reviewed with the patient.  Signed,  Elpidio Galea. Judy Pollman, MD, CA[facility] Sports Medicine  Patient Instructions  Eustachian Tube Dysfunction: There is a tube that connects between the sinuses and behind the ear called the "eustachian tube." Sometimes when you have allergies, a cold, or nasal congestion for any reason this tube  can get blocked and pressure cannot equalize in your ears. (Like if you swim in deep water) This can also trap fluid behind the ear and give you a full, pressure-like sensation that is uncomfortable, but it is not an ear infection.  Recommendations: Afrin Nasal Spray: 2 sprays twice a day for a maximum of 3-4 days (longer than this and your nose gets addicted, and you have rebound swelling that makes it worse.) Sudafed: Either pseudoephedrine or phenylephrine. As directed on box. (Not is heart problems or high blood pressure) Anti-Histamine: Allegra, Zyrtec, or Claritin. All over the counter now and once a day.  Nasal steroid. Nasacort is over the counter now. About 10 prescription ones exist.  If you develop fever > 100.4, then things can change fluid behind the ear does increase your risk of developing an ear infection.    Tinnitus Sounds you hear in your ears and coming from within the ear is called tinnitus. This can be a symptom of many ear disorders. It is often associated with hearing loss.  Tinnitus can be seen with:  Infections.  Ear blockages such as wax buildup.  Meniere's disease.  Ear damage.  Inherited.  Occupational causes. While irritating, it is not usually a threat to health. When the cause of the tinnitus is wax, infection in the middle ear, or foreign body it is easily treated. Hearing loss will usually be reversible.   TREATMENT  When treating the underlying cause does not get rid of tinnitus, it may be necessary to get rid of the unwanted sound by covering it up with more pleasant background noises. This may include music, the radio etc. There are tinnitus maskers which can be worn which produce background noise to cover up the tinnitus. Avoid all medications which tend to make tinnitus worse such as alcohol, caffeine, aspirin, and nicotine. There are many soothing background tapes such as rain, ocean, thunderstorms, etc. These soothing sounds help with sleeping  or resting. Keep all follow-up appointments and referrals. This is important to identify the cause of the problem. It also helps avoid complications, impaired hearing, disability, or chronic pain. Document Released: 08/12/2005 Document Revised: 11/04/2011 Document Reviewed: 03/30/2008 [facility] Patient Information 2015 [place], [place]. This information is not intended to replace advice given to you by your health care provider. Make sure you discuss any questions you have with your health care provider.       Discontinued Medications   No medications on file   Current Medications at Discharge:   Medication List       This list is accurate as of: 03/22/14  5:29 PM.  Always use your most recent med list.               albuterol 108 (90 BASE) MCG/ACT inhaler  Commonly known as:  PROVENTIL HFA;VENTOLIN HFA  Inhale 2 puffs into the lungs every 6 (six) hours as needed.     fluticasone 110 MCG/ACT inhaler  Commonly known as:  FLOVENT HFA  Inhale 1 puff into the lungs 2 (two) times daily.     fluticasone 50 MCG/ACT nasal spray  Commonly known as:  FLONASE  Place 2 sprays into both nostrils daily.     ibuprofen  200 MG tablet  Commonly known as:  ADVIL,MOTRIN  Take 400 mg by mouth every 6 (six) hours as needed.     omeprazole 20 MG capsule  Commonly known as:  PRILOSEC  Take 20 mg by mouth daily.

## 2014-04-01 ENCOUNTER — Ambulatory Visit (INDEPENDENT_AMBULATORY_CARE_PROVIDER_SITE_OTHER): Payer: 59 | Admitting: Physical Therapy

## 2014-04-01 DIAGNOSIS — M25519 Pain in unspecified shoulder: Secondary | ICD-10-CM

## 2014-04-01 DIAGNOSIS — M25619 Stiffness of unspecified shoulder, not elsewhere classified: Secondary | ICD-10-CM

## 2014-04-01 DIAGNOSIS — M7500 Adhesive capsulitis of unspecified shoulder: Secondary | ICD-10-CM

## 2014-04-01 DIAGNOSIS — R609 Edema, unspecified: Secondary | ICD-10-CM

## 2014-04-01 DIAGNOSIS — M6281 Muscle weakness (generalized): Secondary | ICD-10-CM

## 2014-04-05 ENCOUNTER — Encounter (INDEPENDENT_AMBULATORY_CARE_PROVIDER_SITE_OTHER): Payer: 59 | Admitting: Physical Therapy

## 2014-04-05 DIAGNOSIS — M7500 Adhesive capsulitis of unspecified shoulder: Secondary | ICD-10-CM

## 2014-04-05 DIAGNOSIS — M25519 Pain in unspecified shoulder: Secondary | ICD-10-CM

## 2014-04-05 DIAGNOSIS — M25619 Stiffness of unspecified shoulder, not elsewhere classified: Secondary | ICD-10-CM

## 2014-04-05 DIAGNOSIS — R609 Edema, unspecified: Secondary | ICD-10-CM

## 2014-04-05 DIAGNOSIS — M6281 Muscle weakness (generalized): Secondary | ICD-10-CM

## 2014-04-07 ENCOUNTER — Encounter (INDEPENDENT_AMBULATORY_CARE_PROVIDER_SITE_OTHER): Payer: 59

## 2014-04-07 DIAGNOSIS — M7500 Adhesive capsulitis of unspecified shoulder: Secondary | ICD-10-CM

## 2014-04-07 DIAGNOSIS — M25519 Pain in unspecified shoulder: Secondary | ICD-10-CM

## 2014-04-07 DIAGNOSIS — R609 Edema, unspecified: Secondary | ICD-10-CM

## 2014-04-07 DIAGNOSIS — M6281 Muscle weakness (generalized): Secondary | ICD-10-CM

## 2014-04-07 DIAGNOSIS — M25619 Stiffness of unspecified shoulder, not elsewhere classified: Secondary | ICD-10-CM

## 2014-04-08 ENCOUNTER — Emergency Department: Payer: Self-pay | Admitting: Emergency Medicine

## 2014-04-08 LAB — CBC WITH DIFFERENTIAL/PLATELET
Basophil #: 0 10*3/uL (ref 0.0–0.1)
Basophil %: 0.5 %
EOS ABS: 0.1 10*3/uL (ref 0.0–0.7)
Eosinophil %: 2.4 %
HCT: 47.8 % (ref 40.0–52.0)
HGB: 16.1 g/dL (ref 13.0–18.0)
LYMPHS PCT: 16.9 %
Lymphocyte #: 1 10*3/uL (ref 1.0–3.6)
MCH: 30.8 pg (ref 26.0–34.0)
MCHC: 33.7 g/dL (ref 32.0–36.0)
MCV: 92 fL (ref 80–100)
MONOS PCT: 9.5 %
Monocyte #: 0.6 x10 3/mm (ref 0.2–1.0)
NEUTROS PCT: 70.7 %
Neutrophil #: 4.3 10*3/uL (ref 1.4–6.5)
Platelet: 192 10*3/uL (ref 150–440)
RBC: 5.22 10*6/uL (ref 4.40–5.90)
RDW: 12.9 % (ref 11.5–14.5)
WBC: 6.1 10*3/uL (ref 3.8–10.6)

## 2014-04-08 LAB — BASIC METABOLIC PANEL
Anion Gap: 4 — ABNORMAL LOW (ref 7–16)
BUN: 18 mg/dL (ref 7–18)
CALCIUM: 9 mg/dL (ref 8.5–10.1)
CHLORIDE: 105 mmol/L (ref 98–107)
Co2: 29 mmol/L (ref 21–32)
Creatinine: 1.34 mg/dL — ABNORMAL HIGH (ref 0.60–1.30)
EGFR (African American): >60
EGFR (Non-African Amer.): >60
Glucose: 112 mg/dL — ABNORMAL HIGH (ref 65–99)
Osmolality: 278 (ref 275–301)
Potassium: 4.6 mmol/L (ref 3.5–5.1)
Sodium: 138 mmol/L (ref 136–145)

## 2014-04-08 LAB — D-DIMER(ARMC): D-Dimer: 115 ng/ml

## 2014-04-08 LAB — TROPONIN I: Troponin-I: <0.02 ng/mL

## 2014-04-13 ENCOUNTER — Encounter (INDEPENDENT_AMBULATORY_CARE_PROVIDER_SITE_OTHER): Payer: 59 | Admitting: Physical Therapy

## 2014-04-13 DIAGNOSIS — M25519 Pain in unspecified shoulder: Secondary | ICD-10-CM

## 2014-04-13 DIAGNOSIS — M25619 Stiffness of unspecified shoulder, not elsewhere classified: Secondary | ICD-10-CM

## 2014-04-13 DIAGNOSIS — M6281 Muscle weakness (generalized): Secondary | ICD-10-CM

## 2014-04-13 DIAGNOSIS — M7500 Adhesive capsulitis of unspecified shoulder: Secondary | ICD-10-CM

## 2014-04-15 ENCOUNTER — Encounter (INDEPENDENT_AMBULATORY_CARE_PROVIDER_SITE_OTHER): Payer: 59 | Admitting: Physical Therapy

## 2014-04-15 DIAGNOSIS — M25619 Stiffness of unspecified shoulder, not elsewhere classified: Secondary | ICD-10-CM

## 2014-04-15 DIAGNOSIS — R609 Edema, unspecified: Secondary | ICD-10-CM

## 2014-04-15 DIAGNOSIS — M7500 Adhesive capsulitis of unspecified shoulder: Secondary | ICD-10-CM

## 2014-04-15 DIAGNOSIS — M6281 Muscle weakness (generalized): Secondary | ICD-10-CM

## 2014-04-15 DIAGNOSIS — M25519 Pain in unspecified shoulder: Secondary | ICD-10-CM

## 2014-04-19 ENCOUNTER — Encounter (INDEPENDENT_AMBULATORY_CARE_PROVIDER_SITE_OTHER): Payer: 59 | Admitting: Physical Therapy

## 2014-04-19 DIAGNOSIS — M25519 Pain in unspecified shoulder: Secondary | ICD-10-CM

## 2014-04-19 DIAGNOSIS — M6281 Muscle weakness (generalized): Secondary | ICD-10-CM

## 2014-04-19 DIAGNOSIS — M7500 Adhesive capsulitis of unspecified shoulder: Secondary | ICD-10-CM

## 2014-04-19 DIAGNOSIS — R609 Edema, unspecified: Secondary | ICD-10-CM

## 2014-04-19 DIAGNOSIS — M25619 Stiffness of unspecified shoulder, not elsewhere classified: Secondary | ICD-10-CM

## 2014-04-20 ENCOUNTER — Encounter: Payer: Self-pay | Admitting: Family Medicine

## 2014-04-20 ENCOUNTER — Ambulatory Visit (INDEPENDENT_AMBULATORY_CARE_PROVIDER_SITE_OTHER): Payer: 59 | Admitting: Family Medicine

## 2014-04-20 VITALS — BP 120/82 | HR 70 | Temp 98.3°F | Ht 68.75 in | Wt 197.8 lb

## 2014-04-20 DIAGNOSIS — M7501 Adhesive capsulitis of right shoulder: Secondary | ICD-10-CM

## 2014-04-20 DIAGNOSIS — I10 Essential (primary) hypertension: Secondary | ICD-10-CM

## 2014-04-20 DIAGNOSIS — M7500 Adhesive capsulitis of unspecified shoulder: Secondary | ICD-10-CM

## 2014-04-20 MED ORDER — HYDROCHLOROTHIAZIDE 12.5 MG PO TABS: 12.5000 mg | ORAL_TABLET | Freq: Every day | ORAL | Status: DC

## 2014-04-20 NOTE — Progress Notes (Signed)
Pre visit review using our clinic review tool, if applicable. No additional management support is needed unless otherwise documented below in the visit note. 

## 2014-04-20 NOTE — Patient Instructions (Signed)
You need to have a LARGE blood pressure cuff or your BP will look higher than it really is.   Check your cuff at home, if needed then get a LARGE BP cuff from the pharmacy.  Check your blood pressure for the next week, twice a day.    If it routinely is above 140/90, call me and I want to know.

## 2014-04-20 NOTE — Progress Notes (Signed)
Dr. Karleen Hampshire T. Valen Gillison, MD, CA[facility] Sports Medicine Primary Care and Sports Medicine [address] [place], [address] Phone: [phone] Fax: [phone]  04/20/2014  Patient: Vincent Winters, MRN: [id number], DOB: [birth year], 43 y.o.  Primary Physician:  Hannah Beat, MD  Chief Complaint: Hypertension  Subjective:   Vincent Winters is a 43 y.o. very pleasant male patient who presents with the following:  132/96 BP  Frozen shoulder.   BP Readings from Last 3 Encounters:  04/20/14 120/82  03/22/14 130/90  12/15/13 120/76    Basically he is here because his blood pressures been elevated O. Multiple times. He has an old blood pressure cuff at home, he also has a nurse at work who read his blood pressure. It has been sometimes in the 150s range. He also can have headaches every once in a while.  We checked his blood pressure multiple times a day, check this myself, and got138/96.  He is also following up about his frozen shoulder on the RIGHT. He has been quite compliant and has been doing physical therapy and a home exercise program his range of motion has improved, with some remaining deficit in the internal range of motion.  Past Medical History, Surgical History, Social History, Family History, Problem List, Medications, and Allergies have been reviewed and updated if relevant.   GEN: No acute illnesses, no fevers, chills. GI: No n/v/d, eating normally Pulm: No SOB Interactive and getting along well at home.  Otherwise, ROS is as per the HPI.  Objective:   BP 120/82  Pulse 70  Temp(Src) 98.3 F (36.8 C) (Oral)  Ht 5' 8.75" (1.746 m)  Wt 197 lb 12 oz (89.699 kg)  BMI 29.42 kg/m2  GEN: WDWN, NAD, Non-toxic, A & O x 3 HEENT: Atraumatic, Normocephalic. Neck supple. No masses, No LAD. Ears and Nose: No external deformity. CV: RRR, No M/G/R. No JVD. No thrill. No extra heart sounds. PULM: CTA B, no wheezes, crackles, rhonchi. No retractions. No resp. distress.  No accessory muscle use. EXTR: No c/c/e NEURO Normal gait.  PSYCH: Normally interactive. Conversant. Not depressed or anxious appearing.  Calm demeanor.   He is lacking approximately 15 or 20 internal abduction and terminal flexion. He also is lacking about 10 or 15 of external range of motion. He has minimalinternal range of motion at this point.  Laboratory and Imaging Data:  Assessment and Plan:   Essential hypertension  Adhesive capsulitis of right shoulder  Start blood pressure medication and follow up if needed regarding blood pressure.  I filled out his physical therapy orders for PT for his shoulder.  Follow-up: No Follow-up on file.  New Prescriptions   HYDROCHLOROTHIAZIDE (HYDRODIURIL) 12.5 MG TABLET    Take 1 tablet (12.5 mg total) by mouth daily.   Patient Instructions  You need to have a LARGE blood pressure cuff or your BP will look higher than it really is.   Check your cuff at home, if needed then get a LARGE BP cuff from the pharmacy.  Check your blood pressure for the next week, twice a day.    If it routinely is above 140/90, call me and I want to know.     Signed,  Elpidio Galea. Knolan Simien, MD   Patient's Medications  New Prescriptions   HYDROCHLOROTHIAZIDE (HYDRODIURIL) 12.5 MG TABLET    Take 1 tablet (12.5 mg total) by mouth daily.  Previous Medications   ALBUTEROL (PROVENTIL HFA;VENTOLIN HFA) 108 (90 BASE) MCG/ACT INHALER    Inhale  2 puffs into the lungs every 6 (six) hours as needed.   FLUTICASONE (FLONASE) 50 MCG/ACT NASAL SPRAY    Place 2 sprays into both nostrils daily.   FLUTICASONE (FLOVENT HFA) 110 MCG/ACT INHALER    Inhale 1 puff into the lungs 2 (two) times daily.   IBUPROFEN (ADVIL,MOTRIN) 200 MG TABLET    Take 400 mg by mouth every 6 (six) hours as needed.     OMEPRAZOLE (PRILOSEC) 20 MG CAPSULE    Take 20 mg by mouth daily.    Modified Medications   No medications on file  Discontinued Medications   No medications on file

## 2014-04-22 ENCOUNTER — Encounter (INDEPENDENT_AMBULATORY_CARE_PROVIDER_SITE_OTHER): Payer: 59

## 2014-04-22 DIAGNOSIS — M25619 Stiffness of unspecified shoulder, not elsewhere classified: Secondary | ICD-10-CM

## 2014-04-22 DIAGNOSIS — R609 Edema, unspecified: Secondary | ICD-10-CM

## 2014-04-22 DIAGNOSIS — M7500 Adhesive capsulitis of unspecified shoulder: Secondary | ICD-10-CM

## 2014-04-22 DIAGNOSIS — M6281 Muscle weakness (generalized): Secondary | ICD-10-CM

## 2014-04-22 DIAGNOSIS — M25519 Pain in unspecified shoulder: Secondary | ICD-10-CM

## 2014-04-25 ENCOUNTER — Telehealth: Payer: Self-pay | Admitting: Family Medicine

## 2014-04-25 NOTE — Telephone Encounter (Signed)
Patient Information:  Caller Name: Jesusita Oka  Phone: [phone]  Patient: Vincent Winters, Vincent Winters  Gender: Male  DOB: September 15, 1970  Age: 43 Years  PCP: Hannah Beat (Family Practice)  Office Follow Up:  Does the office need to follow up with this patient?: No  Instructions For The Office: N/A  RN Note:  Office called and made aware of disposition; Rene Kocher from the office notified and Dr Patsy Lager made aware; instructed to stop HCTZ and MD will reevaluate him in the office on Wednesday; MD doesn't believe that it is the HCTZ causing these sx but we can try to stop it and see how he does;  but if pain/sx continues or worsens will need to go to ED; pt aware and will comply; appt made for 3:15pm on Wednesday with Dr Patsy Lager  Symptoms  Reason For Call & Symptoms: Pt is calling and states that he was started on BP medication on 04/23/14; HCTZ was started; he is now having abdominal pain and skin pain under rt nipple area; concerned it is a adverse effect from the HCTZ; no rash or lesions;  abdominal pain is located above belly button; pain is constant; rates pain 5/10;  no vomiting; no diarrhea; BP this morning  upon awakening 145/110; P 100; BP 142/98 at work this morning;  feeling a little dizzy but able to carry out daily activities;  these sx started 04/24/2014 at noon; abdominal pain is the worse sx  Reviewed Health History In EMR: Yes  Reviewed Medications In EMR: Yes  Reviewed Allergies In EMR: Yes  Reviewed Surgeries / Procedures: Yes  Date of Onset of Symptoms: 04/24/2014  Guideline(s) Used:  Abdominal Pain - Male  Disposition Per Guideline:   Go to ED Now (or to Office with PCP Approval)  Reason For Disposition Reached:   Constant abdominal pain lasting > 2 hours  Advice Given:  N/A  RN Overrode Recommendation:  Follow Up With Office Later  Stop HCTZ and see in office on Wednesday.

## 2014-04-27 ENCOUNTER — Ambulatory Visit (INDEPENDENT_AMBULATORY_CARE_PROVIDER_SITE_OTHER): Payer: 59 | Admitting: Family Medicine

## 2014-04-27 ENCOUNTER — Encounter: Payer: Self-pay | Admitting: Family Medicine

## 2014-04-27 ENCOUNTER — Encounter (INDEPENDENT_AMBULATORY_CARE_PROVIDER_SITE_OTHER): Payer: 59 | Admitting: Physical Therapy

## 2014-04-27 VITALS — BP 130/90 | HR 73 | Temp 98.3°F | Ht 68.75 in | Wt 193.2 lb

## 2014-04-27 DIAGNOSIS — M25519 Pain in unspecified shoulder: Secondary | ICD-10-CM

## 2014-04-27 DIAGNOSIS — M6281 Muscle weakness (generalized): Secondary | ICD-10-CM

## 2014-04-27 DIAGNOSIS — R109 Unspecified abdominal pain: Secondary | ICD-10-CM

## 2014-04-27 DIAGNOSIS — M7500 Adhesive capsulitis of unspecified shoulder: Secondary | ICD-10-CM

## 2014-04-27 DIAGNOSIS — M25619 Stiffness of unspecified shoulder, not elsewhere classified: Secondary | ICD-10-CM

## 2014-04-27 DIAGNOSIS — R609 Edema, unspecified: Secondary | ICD-10-CM

## 2014-04-27 LAB — POCT URINALYSIS DIPSTICK
BILIRUBIN UA: NEGATIVE
Blood, UA: NEGATIVE
GLUCOSE UA: NEGATIVE
Ketones, UA: NEGATIVE
Leukocytes, UA: NEGATIVE
Nitrite, UA: NEGATIVE
Protein, UA: NEGATIVE
SPEC GRAV UA: 1.015
Urobilinogen, UA: 0.2
pH, UA: 6

## 2014-04-27 NOTE — Progress Notes (Signed)
Pre visit review using our clinic review tool, if applicable. No additional management support is needed unless otherwise documented below in the visit note. 

## 2014-04-27 NOTE — Patient Instructions (Signed)

## 2014-04-27 NOTE — Progress Notes (Signed)
Dr. Karleen Hampshire T. Dollene Mallery, MD, CAQ Sports Medicine Primary Care and Sports Medicine [address] [place], [address] Phone: [phone] Fax: [phone]  04/27/2014  Patient: Vincent Winters, MRN: [id number], DOB: 05-Apr-1971, 43 y.o.  Primary Physician:  Hannah Beat, MD  Chief Complaint: Abdominal Pain  Subjective:   Nicolas Banh is a 43 y.o. very pleasant male patient who presents with the following:  Abdominal pain, underneath shoulder blade, ongoing for a day and a half. Pain into his scrotum. RUQ pain.  Eating and drinking ok.  Pain has been somewhat variable, but mostly RUQ and some pain in the shoulder blade on the right. Now pain in the groin and some in the lower quadrants.   S/p Appy.    Past Medical History, Surgical History, Social History, Family History, Problem List, Medications, and Allergies have been reviewed and updated if relevant.  Past Medical History  Diagnosis Date  . Asthma   . GERD (gastroesophageal reflux disease)   . Hiatal hernia   . Hypertension 09/07/2009    Qualifier: Diagnosis of  By: Patsy Lager MD, Karleen Hampshire      Past Surgical History  Procedure Laterality Date  . Appendectomy       GEN: No acute illnesses, no fevers, chills. GI: No n/v/d, eating normally Pulm: No SOB Interactive and getting along well at home.  Otherwise, ROS is as per the HPI.  Objective:   BP 130/90  Pulse 73  Temp(Src) 98.3 F (36.8 C) (Oral)  Ht 5' 8.75" (1.746 m)  Wt 193 lb 4 oz (87.658 kg)  BMI 28.75 kg/m2   GEN: WDWN, NAD, Non-toxic, Alert & Oriented x 3 HEENT: Atraumatic, Normocephalic.  Ears and Nose: No external deformity. CV: RRR, no m/g/r  PULM: Normal respiratory rate, no accessory muscle use. No wheezes, crackles or rhonchi  ABD: S, diffuse non-focal moderate tenderness, ND, + BS, No rebound, No HSM  EXTR: No clubbing/cyanosis/edema NEURO: Normal gait.  PSYCH: Normally interactive. Conversant. Not depressed or anxious  appearing.  Calm demeanor.   Laboratory and Imaging Data:  Assessment and Plan:   Flank pain - Plan: POCT urinalysis dipstick, CBC with Differential, Basic metabolic panel, Hepatic function panel, Lipase, US Abdomen Complete  Abdominal pain, unspecified site - Plan: CBC with Differential, Basic metabolic panel, Hepatic function panel, Lipase, US Abdomen Complete  Obtain abdominal ultrasound, concern for potential cholecystitis or other acute intraabdominal process. ASAP Abd labs  Follow-up: No Follow-up on file.  New Prescriptions   No medications on file   Orders Placed This Encounter  Procedures  . US Abdomen Complete  . CBC with Differential  . Basic metabolic panel  . Hepatic function panel  . Lipase  . POCT urinalysis dipstick    Signed,  Karleen Hampshire T. Shaunette Gassner, MD   Patient's Medications  New Prescriptions   No medications on file  Previous Medications   ALBUTEROL (PROVENTIL HFA;VENTOLIN HFA) 108 (90 BASE) MCG/ACT INHALER    Inhale 2 puffs into the lungs every 6 (six) hours as needed.   FLUTICASONE (FLONASE) 50 MCG/ACT NASAL SPRAY    Place 2 sprays into both nostrils daily.   FLUTICASONE (FLOVENT HFA) 110 MCG/ACT INHALER    Inhale 1 puff into the lungs 2 (two) times daily.   HYDROCHLOROTHIAZIDE (HYDRODIURIL) 12.5 MG TABLET    Take 1 tablet (12.5 mg total) by mouth daily.   IBUPROFEN (ADVIL,MOTRIN) 200 MG TABLET    Take 400 mg by mouth every 6 (six) hours as needed.  OMEPRAZOLE (PRILOSEC) 20 MG CAPSULE    Take 20 mg by mouth daily.    Modified Medications   No medications on file  Discontinued Medications   No medications on file

## 2014-04-28 ENCOUNTER — Ambulatory Visit: Payer: Self-pay | Admitting: Family Medicine

## 2014-04-28 LAB — HEPATIC FUNCTION PANEL
ALBUMIN: 4 g/dL (ref 3.5–5.2)
ALK PHOS: 63 U/L (ref 39–117)
ALT: 18 U/L (ref 0–53)
AST: 20 U/L (ref 0–37)
BILIRUBIN DIRECT: 0 mg/dL (ref 0.0–0.3)
TOTAL PROTEIN: 7 g/dL (ref 6.0–8.3)
Total Bilirubin: 0.6 mg/dL (ref 0.2–1.2)

## 2014-04-28 LAB — CBC WITH DIFFERENTIAL/PLATELET
BASOS ABS: 0 10*3/uL (ref 0.0–0.1)
Basophils Relative: 0.5 % (ref 0.0–3.0)
EOS ABS: 0.1 10*3/uL (ref 0.0–0.7)
Eosinophils Relative: 1.6 % (ref 0.0–5.0)
HCT: 47.4 % (ref 39.0–52.0)
HEMOGLOBIN: 15.9 g/dL (ref 13.0–17.0)
LYMPHS PCT: 23.1 % (ref 12.0–46.0)
Lymphs Abs: 1.8 10*3/uL (ref 0.7–4.0)
MCHC: 33.6 g/dL (ref 30.0–36.0)
MCV: 92.6 fl (ref 78.0–100.0)
Monocytes Absolute: 0.7 10*3/uL (ref 0.1–1.0)
Monocytes Relative: 8.6 % (ref 3.0–12.0)
Neutro Abs: 5.2 10*3/uL (ref 1.4–7.7)
Neutrophils Relative %: 66.2 % (ref 43.0–77.0)
Platelets: 246 10*3/uL (ref 150.0–400.0)
RBC: 5.12 Mil/uL (ref 4.22–5.81)
RDW: 12.9 % (ref 11.5–15.5)
WBC: 7.9 10*3/uL (ref 4.0–10.5)

## 2014-04-28 LAB — COMPREHENSIVE METABOLIC PANEL
ALK PHOS: 63 U/L (ref 39–117)
ALT: 18 U/L (ref 0–53)
AST: 20 U/L (ref 0–37)
Albumin: 4 g/dL (ref 3.5–5.2)
BILIRUBIN TOTAL: 0.6 mg/dL (ref 0.2–1.2)
BUN: 16 mg/dL (ref 6–23)
CO2: 31 mEq/L (ref 19–32)
CREATININE: 1.4 mg/dL (ref 0.4–1.5)
Calcium: 9.3 mg/dL (ref 8.4–10.5)
Chloride: 101 mEq/L (ref 96–112)
GFR: 56.53 mL/min — ABNORMAL LOW (ref >60.00)
Glucose, Bld: 79 mg/dL (ref 70–99)
Potassium: 4.2 mEq/L (ref 3.5–5.1)
Sodium: 137 mEq/L (ref 135–145)
Total Protein: 7 g/dL (ref 6.0–8.3)

## 2014-04-28 LAB — LIPASE: Lipase: 25 U/L (ref 11.0–59.0)

## 2014-04-28 NOTE — Addendum Note (Signed)
Addended by: Baldomero Lamy on: 04/28/2014 09:04 AM   Modules accepted: Orders

## 2014-04-29 ENCOUNTER — Emergency Department: Payer: Self-pay | Admitting: Emergency Medicine

## 2014-04-29 ENCOUNTER — Ambulatory Visit (INDEPENDENT_AMBULATORY_CARE_PROVIDER_SITE_OTHER)
Admission: RE | Admit: 2014-04-29 | Discharge: 2014-04-29 | Disposition: A | Discharge status: Home / Self Care | Payer: 59 | Source: Ambulatory Visit | Attending: Family Medicine | Admitting: Family Medicine

## 2014-04-29 ENCOUNTER — Encounter: Payer: Self-pay | Admitting: Family Medicine

## 2014-04-29 ENCOUNTER — Telehealth: Payer: Self-pay

## 2014-04-29 DIAGNOSIS — R1084 Generalized abdominal pain: Secondary | ICD-10-CM

## 2014-04-29 DIAGNOSIS — R52 Pain, unspecified: Secondary | ICD-10-CM

## 2014-04-29 LAB — URINALYSIS, COMPLETE
BLOOD: NEGATIVE
Bacteria: NONE SEEN
Bilirubin,UR: NEGATIVE
GLUCOSE, UR: NEGATIVE mg/dL (ref 0–75)
Ketone: NEGATIVE
Leukocyte Esterase: NEGATIVE
Nitrite: NEGATIVE
PROTEIN: NEGATIVE
Ph: 6 (ref 4.5–8.0)
Specific Gravity: 1.014 (ref 1.003–1.030)
Squamous Epithelial: NONE SEEN

## 2014-04-29 LAB — COMPREHENSIVE METABOLIC PANEL
ALBUMIN: 3.8 g/dL (ref 3.4–5.0)
ALK PHOS: 77 U/L
Anion Gap: 7 (ref 7–16)
BILIRUBIN TOTAL: 0.5 mg/dL (ref 0.2–1.0)
BUN: 11 mg/dL (ref 7–18)
Calcium, Total: 8.5 mg/dL (ref 8.5–10.1)
Chloride: 104 mmol/L (ref 98–107)
Co2: 25 mmol/L (ref 21–32)
Creatinine: 1.37 mg/dL — ABNORMAL HIGH (ref 0.60–1.30)
GLUCOSE: 87 mg/dL (ref 65–99)
OSMOLALITY: 271 (ref 275–301)
POTASSIUM: 3.8 mmol/L (ref 3.5–5.1)
SGOT(AST): 13 U/L — ABNORMAL LOW (ref 15–37)
SGPT (ALT): 23 U/L
Sodium: 136 mmol/L (ref 136–145)
Total Protein: 6.8 g/dL (ref 6.4–8.2)

## 2014-04-29 LAB — CBC WITH DIFFERENTIAL/PLATELET
BASOS ABS: 0.1 10*3/uL (ref 0.0–0.1)
Basophil %: 0.9 %
Eosinophil #: 0.1 10*3/uL (ref 0.0–0.7)
Eosinophil %: 1.7 %
HCT: 46.5 % (ref 40.0–52.0)
HGB: 15.7 g/dL (ref 13.0–18.0)
LYMPHS PCT: 27.8 %
Lymphocyte #: 1.9 10*3/uL (ref 1.0–3.6)
MCH: 30.8 pg (ref 26.0–34.0)
MCHC: 33.7 g/dL (ref 32.0–36.0)
MCV: 91 fL (ref 80–100)
MONO ABS: 0.6 x10 3/mm (ref 0.2–1.0)
Monocyte %: 8.8 %
Neutrophil #: 4.2 10*3/uL (ref 1.4–6.5)
Neutrophil %: 60.8 %
Platelet: 197 10*3/uL (ref 150–440)
RBC: 5.09 10*6/uL (ref 4.40–5.90)
RDW: 13 % (ref 11.5–14.5)
WBC: 7 10*3/uL (ref 3.8–10.6)

## 2014-04-29 LAB — TROPONIN I: Troponin-I: <0.02 ng/mL

## 2014-04-29 LAB — LIPASE, BLOOD: Lipase: 145 U/L (ref 73–393)

## 2014-04-29 MED ORDER — CIPROFLOXACIN HCL 750 MG PO TABS: 750.0000 mg | ORAL_TABLET | Freq: Two times a day (BID) | ORAL | Status: DC

## 2014-04-29 MED ORDER — METRONIDAZOLE 500 MG PO TABS: 500.0000 mg | ORAL_TABLET | Freq: Three times a day (TID) | ORAL | Status: DC

## 2014-04-29 MED ORDER — IOHEXOL 300 MG/ML  SOLN
100.0000 mL | Freq: Once | INTRAMUSCULAR | Status: AC | PRN
  Administered 2014-04-29: 100 mL via INTRAVENOUS

## 2014-04-29 NOTE — Telephone Encounter (Signed)
I spoke with the patient. He is still having RUQ and diffuse abdominal pain. Some shoulder blade pain. 7/10 pain.   Unclear etiology. I am going to try to get a STAT CT of abd and pelvis today to evaluate for diverticulitis, colitis, free air, perf bowel, abscess, or any other acute intraabdominal pathology.   Meds ordered this encounter  Medications  . ciprofloxacin (CIPRO) 750 MG tablet    Sig: Take 1 tablet (750 mg total) by mouth 2 (two) times daily.    Dispense:  20 tablet    Refill:  0  . metroNIDAZOLE (FLAGYL) 500 MG tablet    Sig: Take 1 tablet (500 mg total) by mouth 3 (three) times daily.    Dispense:  30 tablet    Refill:  0   To cover diverticulitis or colitis.   I am going to cc: my partner also.  Signed,  Elpidio Galea. Leib Elahi, Vincent Winters

## 2014-04-29 NOTE — Telephone Encounter (Signed)
Ultrasound was normal.  Given to [place] to STAT Scan.

## 2014-04-29 NOTE — Telephone Encounter (Signed)
Patient set up at [facility] CT for today.

## 2014-04-29 NOTE — Telephone Encounter (Signed)
Pt left v/m requesting cb on [place] of abdomen done on 04/28/14 at [facility]. Spoke with [facility] [place] and they will fax over report to Lupita Leash CMA at [phone]. Pt request cb today [phone] and wants to know what to do about pain over weekend. Pt has no medication. Pain level now is  7 and during the night the pain level was between 7 -8; affecting pts ability to sleep. Today pain is no longer intermittent but basically constant pressure pain in upper abdomen (under rt nipple) that goes thru to back at shoulder blade. No SOB. [facility] [address].

## 2014-05-03 ENCOUNTER — Telehealth: Payer: Self-pay | Admitting: Family Medicine

## 2014-05-03 DIAGNOSIS — R109 Unspecified abdominal pain: Secondary | ICD-10-CM

## 2014-05-03 NOTE — Telephone Encounter (Signed)
Pt would like to know what the next course of action is after having his CT on 04/29/14.  He said that he knows the CT did not show anything significant but is still experiencing some pain.

## 2014-05-04 NOTE — Telephone Encounter (Signed)
Please check with him. I spoke to him at length on Friday evening. I thought he was going to go to the ER.   I need to know if he went to the ER at [facility] or not, if he did, then I need records. If not, then I will try to arrange disposition.

## 2014-05-04 NOTE — Telephone Encounter (Signed)
Spoke with Jesusita Oka.  He states he did go to [facility] ED as instructed by Dr. Patsy Lager.  Records requested.  Vianne Bulls I would have Dr. Patsy Lager review his ED records and we would go from there.

## 2014-05-04 NOTE — Telephone Encounter (Signed)
Please call patient back.  Call patient at [phone].

## 2014-05-11 ENCOUNTER — Ambulatory Visit: Payer: Self-pay | Admitting: Urgent Care

## 2014-05-30 ENCOUNTER — Ambulatory Visit: Payer: Self-pay | Admitting: Gastroenterology

## 2014-06-13 ENCOUNTER — Encounter: Payer: Self-pay | Admitting: Family Medicine

## 2014-06-13 ENCOUNTER — Ambulatory Visit (INDEPENDENT_AMBULATORY_CARE_PROVIDER_SITE_OTHER): Payer: 59 | Admitting: Family Medicine

## 2014-06-13 VITALS — BP 124/84 | HR 63 | Temp 98.2°F | Ht 68.75 in | Wt 186.5 lb

## 2014-06-13 DIAGNOSIS — G5700 Lesion of sciatic nerve, unspecified lower limb: Secondary | ICD-10-CM

## 2014-06-13 DIAGNOSIS — M5430 Sciatica, unspecified side: Secondary | ICD-10-CM

## 2014-06-13 DIAGNOSIS — R0789 Other chest pain: Secondary | ICD-10-CM

## 2014-06-13 NOTE — Progress Notes (Signed)
Dr. Karleen HampshireSpencer T. Idamae Coccia, MD, CAQ Sports Medicine Primary Care and Sports Medicine [address] [place], [address] Phone: [phone] Fax: [phone]  06/13/2014  Patient: Vincent Winters, MRN: [id number], DOB: 02/24/1971, 43 y.o.  Primary Physician:  Hannah BeatSpencer Kearstin Learn, MD  Chief Complaint: Numbness and Follow-up  Subjective:   Vincent Winters is a 43 y.o. very pleasant male patient who presents with the following:  Multiple issues. Extensive c/o with GI system, abdomen with barrett's esophagus and on PPI. Also with concerns about L sided chest pain.   When sitting for even five minutes, will get some tingling and numbness. Sitting and driving. Ongoing intermittently for 6 months to a year. Down the back of both legs.   Now, he describes the pain that he was having in his abdomen and as chest pain. No nausea. Extensive GI work-up showed Barrett's, but it was otherwise unremarkable. No Chest pain with exertion.  Cardiac risk factor: Uncle with CAD in his 2150's 1st cousin with MI in his 3040's. No significant drug history No tob S/p trop negative in ER for evaluation.    Past Medical History, Surgical History, Social History, Family History, Problem List, Medications, and Allergies have been reviewed and updated if relevant.   GEN: No acute illnesses, no fevers, chills. GI: as above Pulm: No SOB Interactive and getting along well at home.  Otherwise, ROS is as per the HPI.  Objective:   BP 124/84  Pulse 63  Temp(Src) 98.2 F (36.8 C) (Oral)  Ht 5' 8.75" (1.746 m)  Wt 186 lb 8 oz (84.596 kg)  BMI 27.75 kg/m2  GEN: WDWN, NAD, Non-toxic, A & O x 3 HEENT: Atraumatic, Normocephalic. Neck supple. No masses, No LAD. Ears and Nose: No external deformity. CV: RRR, No M/G/R. No JVD. No thrill. No extra heart sounds. PULM: CTA B, no wheezes, crackles, rhonchi. No retractions. No resp. distress. No accessory muscle use. EXTR: No c/c/e NEURO Normal gait.   PSYCH: Normally interactive. Conversant. Not depressed or anxious appearing.  Calm demeanor.   Range of motion at  the waist: Flexion: normal Extension: normal Lateral bending: normal Rotation: all normal  No echymosis or edema Rises to examination table with no difficulty Gait: non antalgic  Inspection/Deformity: N Paraspinus Tenderness: minimal  B Ankle Dorsiflexion (L5,4): 5/5 B Great Toe Dorsiflexion (L5,4): 5/5 Heel Walk (L5): WNL Toe Walk (S1): WNL Rise/Squat (L4): WNL  SENSORY B Medial Foot (L4): WNL B Dorsum (L5): WNL B Lateral (S1): WNL Light Touch: WNL Pinprick: WNL  REFLEXES Knee (L4): 2+ Ankle (S1): 2+  B SLR, seated: neg B SLR, supine: neg B FABER: neg B Reverse FABER: POS B Greater Troch: NT B Log Roll: neg B Stork: NT B Sciatic Notch: NT   Laboratory and Imaging Data: EGD reviewed.  Assessment and Plan:   Other chest pain - Plan: Exercise tolerance test  Piriformis syndrome, unspecified laterality  Sciatica, unspecified laterality  Back exam relatively benign except spasm at piriformis region, which is the obvious answer for sciatica when sitting.  A rehabilitation program from the [employer] was reviewed with the patient face to face for their condition.   With regards to his ongoing chest pain, he has significant concerns about coronary disease. Family history of a 1st cousin d/c in his 440's from CAD and an uncle with CAD in his 4150's. Certainly reasonable to do an ETT for risk stratification for potential inducible ischemia.   New Prescriptions  No medications on file   Orders Placed This Encounter  Procedures  . Exercise tolerance test   Patient Instructions  PIRIFORMIS SYNDROME REHAB 1. Work on pretzel stretching, shoulder back and leg draped in front. 3-5 sets, 30 sec.. 2. hip abductor rotations. standing, hip flexion and rotation outward then inward. 3 sets, 15 reps. when can do comfortably, add  ankle weights starting at 2 pounds.  3. cross over stretching - shoulder back to ground, same side leg crossover. 3-5 sets for 30 min..  4. SINK STRETCH - YOU CAN DO THIS WHENEVER YOU WANT DURING THE DAY  Tennis ball underneath area in buttocks - on a hard surface underneath Can also massage this area with an Hydrologistelectronic massager or hand      Signed,  Rahel Carlton T. Donta Mcinroy, MD   Patient's Medications  New Prescriptions   No medications on file  Previous Medications   ALBUTEROL (PROVENTIL HFA;VENTOLIN HFA) 108 (90 BASE) MCG/ACT INHALER    Inhale 2 puffs into the lungs every 6 (six) hours as needed.   FLUTICASONE (FLOVENT HFA) 110 MCG/ACT INHALER    Inhale 1 puff into the lungs 2 (two) times daily.   HYDROCHLOROTHIAZIDE (HYDRODIURIL) 12.5 MG TABLET    Take 1 tablet (12.5 mg total) by mouth daily.   IBUPROFEN (ADVIL,MOTRIN) 200 MG TABLET    Take 400 mg by mouth every 6 (six) hours as needed.     OMEPRAZOLE (PRILOSEC) 20 MG CAPSULE    Take 20 mg by mouth daily.    Modified Medications   No medications on file  Discontinued Medications   CIPROFLOXACIN (CIPRO) 750 MG TABLET    Take 1 tablet (750 mg total) by mouth 2 (two) times daily.   FLUTICASONE (FLONASE) 50 MCG/ACT NASAL SPRAY    Place 2 sprays into both nostrils daily.   METRONIDAZOLE (FLAGYL) 500 MG TABLET    Take 1 tablet (500 mg total) by mouth 3 (three) times daily.

## 2014-06-13 NOTE — Progress Notes (Signed)
Pre visit review using our clinic review tool, if applicable. No additional management support is needed unless otherwise documented below in the visit note. 

## 2014-06-13 NOTE — Patient Instructions (Signed)

## 2014-06-15 ENCOUNTER — Encounter: Payer: Self-pay | Admitting: Family Medicine

## 2014-06-27 ENCOUNTER — Ambulatory Visit (INDEPENDENT_AMBULATORY_CARE_PROVIDER_SITE_OTHER): Payer: 59 | Admitting: Cardiovascular Disease

## 2014-06-27 ENCOUNTER — Encounter: Payer: Self-pay | Admitting: Cardiovascular Disease

## 2014-06-27 DIAGNOSIS — R0789 Other chest pain: Secondary | ICD-10-CM

## 2014-06-27 NOTE — Procedures (Signed)
Exercise Treadmill Test  Treadmill ordered for recent epsiodes of chest pain.  Resting EKG shows NSR with rate of 85 bpm, no significant ST or T-wave changes, rare APC Resting blood pressure of 157/110 Stand bruce protocal was used.  Patient exercised for 7 min 30 sec,  Peak heart rate of 160 bpm.  This was 90% of the maximum predicted heart rate (176). Achieved 9.2 METS No symptoms of chest pain or lightheadedness were reported at peak stress or in recovery.  Peak Blood pressure recorded was 215/99. Heart rate at 3 minutes in recovery was 104 bpm. No ST changes concerning for ischemia at peak stress or recovery  FINAL IMPRESSION: Normal exercise stress test. No significant EKG changes concerning for ischemia. Good exercise tolerance.

## 2014-06-27 NOTE — Patient Instructions (Signed)
Normal Stress Test  

## 2014-10-13 ENCOUNTER — Telehealth: Payer: Self-pay | Admitting: Family Medicine

## 2014-10-13 NOTE — Telephone Encounter (Signed)
Agreed -

## 2014-10-13 NOTE — Telephone Encounter (Signed)
Noted  

## 2014-10-13 NOTE — Telephone Encounter (Signed)
[facility] - Client TELEPHONE ADVICE RECORD [facility] Patient Name: Vincent Winters DOB: 05-20-1971 Initial Comment Caller states he is having shoulder and chest pain. Nurse Assessment Nurse: Yetta BarreJones, RN, Miranda Date/Time (Eastern Time): 10/13/2014 8:58:10 AM Confirm and document reason for call. If symptomatic, describe symptoms. ---Caller states has chronic left shoulder/arm pit pain/around his breast. He had a stress test the end of last year that was normal. No one has said what is causing the pain. He is not having pain at this time, it comes and goes. Has the patient traveled out of the country within the last 30 days? ---Not Applicable Does the patient require triage? ---Yes Related visit to physician within the last 2 weeks? ---No Does the PT have any chronic conditions? (i.e. diabetes, asthma, etc.) ---Yes List chronic conditions. ---GERD, HTN Guidelines Guideline Title Affirmed Question Affirmed Notes Shoulder Pain Shoulder pain is a chronic symptom (recurrent or ongoing AND present > 4 weeks) Final Disposition User See PCP within 2 Olen PelWeeks Jones, RN, Miranda Comments Appt scheduled for 2/22 at 3:45p with Dr. Patsy Lageropland. He had an appt scheduled for this for March 2nd, I am cancelling that appt.

## 2014-10-17 ENCOUNTER — Encounter: Payer: Self-pay | Admitting: Family Medicine

## 2014-10-17 ENCOUNTER — Ambulatory Visit (INDEPENDENT_AMBULATORY_CARE_PROVIDER_SITE_OTHER): Payer: 59 | Admitting: Family Medicine

## 2014-10-17 ENCOUNTER — Ambulatory Visit (INDEPENDENT_AMBULATORY_CARE_PROVIDER_SITE_OTHER)
Admission: RE | Admit: 2014-10-17 | Discharge: 2014-10-17 | Disposition: A | Discharge status: Home / Self Care | Payer: 59 | Source: Ambulatory Visit | Attending: Family Medicine | Admitting: Family Medicine

## 2014-10-17 VITALS — BP 137/101 | HR 78 | Temp 98.6°F | Ht 64.0 in | Wt 193.2 lb

## 2014-10-17 DIAGNOSIS — I10 Essential (primary) hypertension: Secondary | ICD-10-CM

## 2014-10-17 DIAGNOSIS — R0789 Other chest pain: Secondary | ICD-10-CM

## 2014-10-17 DIAGNOSIS — L209 Atopic dermatitis, unspecified: Secondary | ICD-10-CM

## 2014-10-17 MED ORDER — HYDROCHLOROTHIAZIDE 25 MG PO TABS: 25.0000 mg | ORAL_TABLET | Freq: Every day | ORAL | Status: DC

## 2014-10-17 MED ORDER — TRIAMCINOLONE ACETONIDE 0.1 % EX CREA: 1.0000 "application " | TOPICAL_CREAM | Freq: Two times a day (BID) | CUTANEOUS | Status: DC

## 2014-10-17 NOTE — Progress Notes (Signed)
Dr. Karleen Hampshire T. Margaret Cockerill, MD, CAQ Sports Medicine Primary Care and Sports Medicine [address] [place], [address] Phone: [phone] Fax: [phone]  10/17/2014  Patient: Vincent Winters, MRN: [id number], DOB: [birth year], 44 y.o.  Primary Physician:  Hannah Beat, MD  Chief Complaint: Follow-up and Itching around ankles  Subjective:   Vincent Winters is a 44 y.o. very pleasant male patient who presents with the following:  F/u chest pain.  Still getting chest and shoulder. 4-5 months of chest pain every day.  He is very frustrated by this, and he is still having very significant chest pain almost every day.  He cannot think of anything specifically makes it worse.  No foods particularly make it worse.  His reflux is under well control.  He has not had any wheezing or significant illness at all.  His asthma is perfectly stable, and he thinks that this does not feel in any way like an asthma attack.  He is neither depressed nor anxious.  He does not have pain when he takes a deep breath.  He does not have reproducible pain on examination.  We did an exercise tolerance test in November 2015, and this was perfectly normal.  He denies any significant smoking history, no drug use.   Past Medical History, Surgical History, Social History, Family History, Problem List, Medications, and Allergies have been reviewed and updated if relevant.   GEN: No acute illnesses, no fevers, chills. GI: No n/v/d, eating normally Pulm: No SOB Interactive and getting along well at home.  Otherwise, ROS is as per the HPI.  Objective:   BP 137/101 mmHg  Pulse 78  Temp(Src) 98.6 F (37 C) (Oral)  Ht  (1.626 m)  Wt 193 lb 4 oz (87.658 kg)  BMI 33.16 kg/m2  SpO2 97%  GEN: WDWN, NAD, Non-toxic, A & O x 3 HEENT: Atraumatic, Normocephalic. Neck supple. No masses, No LAD. Ears and Nose: No external deformity. CV: RRR, No M/G/R. No JVD. No thrill. No extra heart  sounds. PULM: CTA B, no wheezes, crackles, rhonchi. No retractions. No resp. distress. No accessory muscle use. EXTR: No c/c/e NEURO Normal gait.  PSYCH: Normally interactive. Conversant. Not depressed or anxious appearing.  Calm demeanor.   Laboratory and Imaging Data: Antonieta Iba, MD   06/27/2014 9:17 AM Exercise Treadmill Test  Treadmill ordered for recent epsiodes of chest pain.  Resting EKG shows NSR with rate of 85 bpm, no significant ST or  T-wave changes, rare APC Resting blood pressure of 157/110 Stand bruce protocal was used.  Patient exercised for 7 min 30 sec,  Peak heart rate of 160 bpm. This was 90% of the maximum  predicted heart rate (176). Achieved 9.2 METS No symptoms of chest pain or lightheadedness were reported at  peak stress or in recovery.  Peak Blood pressure recorded was 215/99. Heart rate at 3 minutes in recovery was 104 bpm. No ST changes concerning for ischemia at peak stress or recovery  FINAL IMPRESSION: Normal exercise stress test. No significant EKG changes  concerning for ischemia. Good exercise tolerance.  Chest x-ray, AP and lateral Indication: Chest pain Findings: There is no evidence for acute infiltrate.  There appeared to be no rib fractures.  There appears to be no masses.  Heart silhouette appears grossly normal.  At this time, formal radiological interpretation is pending. Electronically Signed  By: Hannah Beat, MD On: 10/18/2014 8:21 AM   Results for orders placed or performed in  visit on 04/27/14  Lipase  Result Value Ref Range   Lipase 25.0 11.0 - 59.0 U/L  CBC with Differential  Result Value Ref Range   WBC 7.9 4.0 - 10.5 K/uL   RBC 5.12 4.22 - 5.81 Mil/uL   Hemoglobin 15.9 13.0 - 17.0 g/dL   HCT 16.1 09.6 - 04.5 %   MCV 92.6 78.0 - 100.0 fl   MCHC 33.6 30.0 - 36.0 g/dL   RDW 40.9 81.1 - 91.4 %   Platelets 246.0 150.0 - 400.0 K/uL   Neutrophils Relative % 66.2 43.0 - 77.0 %   Lymphocytes Relative 23.1 12.0 -  46.0 %   Monocytes Relative 8.6 3.0 - 12.0 %   Eosinophils Relative 1.6 0.0 - 5.0 %   Basophils Relative 0.5 0.0 - 3.0 %   Neutro Abs 5.2 1.4 - 7.7 K/uL   Lymphs Abs 1.8 0.7 - 4.0 K/uL   Monocytes Absolute 0.7 0.1 - 1.0 K/uL   Eosinophils Absolute 0.1 0.0 - 0.7 K/uL   Basophils Absolute 0.0 0.0 - 0.1 K/uL  Comprehensive metabolic panel  Result Value Ref Range   Sodium 137 135 - 145 mEq/L   Potassium 4.2 3.5 - 5.1 mEq/L   Chloride 101 96 - 112 mEq/L   CO2 31 19 - 32 mEq/L   Glucose, Bld 79 70 - 99 mg/dL   BUN 16 6 - 23 mg/dL   Creatinine, Ser 1.4 0.4 - 1.5 mg/dL   Total Bilirubin 0.6 0.2 - 1.2 mg/dL   Alkaline Phosphatase 63 39 - 117 U/L   AST 20 0 - 37 U/L   ALT 18 0 - 53 U/L   Total Protein 7.0 6.0 - 8.3 g/dL   Albumin 4.0 3.5 - 5.2 g/dL   Calcium 9.3 8.4 - 78.2 mg/dL   GFR 95.62 (L) >13.08 mL/min  Hepatic function panel  Result Value Ref Range   Total Bilirubin 0.6 0.2 - 1.2 mg/dL   Bilirubin, Direct 0.0 0.0 - 0.3 mg/dL   Alkaline Phosphatase 63 39 - 117 U/L   AST 20 0 - 37 U/L   ALT 18 0 - 53 U/L   Total Protein 7.0 6.0 - 8.3 g/dL   Albumin 4.0 3.5 - 5.2 g/dL  POCT urinalysis dipstick  Result Value Ref Range   Color, UA yellow    Clarity, UA clear    Glucose, UA negative    Bilirubin, UA negative    Ketones, UA negative    Spec Grav, UA 1.015    Blood, UA negative    pH, UA 6.0    Protein, UA negative    Urobilinogen, UA 0.2    Nitrite, UA negative    Leukocytes, UA Negative     D-dimer, troponins negative. 04/08/2014, [facility] ED  Assessment and Plan:     Other chest pain - Plan: DG Chest 2 View, Ambulatory referral to Cardiology  Essential hypertension  Atopic dermatitis, mild  I do not have a reason for this gentleman is persistent chest pain.  This is concerning, and I would like to have cardiology evaluate him and give their opinion also.  Scattered, mild atopic dermatitis, worse in the winter.  Triamcinolone cream.  Blood pressure is mildly  elevated, increase diuretic.  New Prescriptions   TRIAMCINOLONE CREAM (KENALOG) 0.1 %    Apply 1 application topically 2 (two) times daily.   Orders Placed This Encounter  Procedures  . DG Chest 2 View  . Ambulatory referral to Cardiology  Signed,  Elpidio GaleaSpencer T. Dillen Belmontes, MD   Patient's Medications  New Prescriptions   TRIAMCINOLONE CREAM (KENALOG) 0.1 %    Apply 1 application topically 2 (two) times daily.  Previous Medications   ALBUTEROL (PROVENTIL HFA;VENTOLIN HFA) 108 (90 BASE) MCG/ACT INHALER    Inhale 2 puffs into the lungs every 6 (six) hours as needed.   FLUTICASONE (FLOVENT HFA) 110 MCG/ACT INHALER    Inhale 1 puff into the lungs 2 (two) times daily.   HYOSCYAMINE (LEVSIN SL) 0.125 MG SL TABLET       IBUPROFEN (ADVIL,MOTRIN) 200 MG TABLET    Take 400 mg by mouth every 6 (six) hours as needed.     OMEPRAZOLE (PRILOSEC) 20 MG CAPSULE    Take 20 mg by mouth daily.    Modified Medications   Modified Medication Previous Medication   HYDROCHLOROTHIAZIDE (HYDRODIURIL) 25 MG TABLET hydrochlorothiazide (HYDRODIURIL) 12.5 MG tablet      Take 1 tablet (25 mg total) by mouth daily.    Take 1 tablet (12.5 mg total) by mouth daily.  Discontinued Medications   No medications on file

## 2014-10-17 NOTE — Progress Notes (Signed)
Pre visit review using our clinic review tool, if applicable. No additional management support is needed unless otherwise documented below in the visit note. 

## 2014-10-17 NOTE — Patient Instructions (Signed)

## 2014-10-19 ENCOUNTER — Ambulatory Visit (INDEPENDENT_AMBULATORY_CARE_PROVIDER_SITE_OTHER): Payer: 59 | Admitting: Cardiovascular Disease

## 2014-10-19 ENCOUNTER — Encounter: Payer: Self-pay | Admitting: Cardiovascular Disease

## 2014-10-19 VITALS — BP 140/100 | HR 69 | Ht 68.0 in | Wt 191.1 lb

## 2014-10-19 DIAGNOSIS — J4520 Mild intermittent asthma, uncomplicated: Secondary | ICD-10-CM

## 2014-10-19 DIAGNOSIS — I10 Essential (primary) hypertension: Secondary | ICD-10-CM

## 2014-10-19 DIAGNOSIS — M25511 Pain in right shoulder: Secondary | ICD-10-CM

## 2014-10-19 DIAGNOSIS — R079 Chest pain, unspecified: Secondary | ICD-10-CM | POA: Insufficient documentation

## 2014-10-19 DIAGNOSIS — K219 Gastro-esophageal reflux disease without esophagitis: Secondary | ICD-10-CM

## 2014-10-19 MED ORDER — OMEPRAZOLE 20 MG PO CPDR: 20.0000 mg | DELAYED_RELEASE_CAPSULE | Freq: Two times a day (BID) | ORAL | Status: DC

## 2014-10-19 MED ORDER — NAPROXEN 500 MG PO TABS: 500.0000 mg | ORAL_TABLET | Freq: Two times a day (BID) | ORAL | Status: DC | PRN

## 2014-10-19 MED ORDER — AMLODIPINE BESYLATE 10 MG PO TABS: 10.0000 mg | ORAL_TABLET | Freq: Every day | ORAL | Status: DC

## 2014-10-19 NOTE — Patient Instructions (Addendum)
Hold the HCTZ Start amlodipine one a day  Try naproxen 1 to 2 pills twice a day as needed for pain  Consider increasing the omeprazole up to twice a day  Consider a CT coronary calcium score if chest pain continues  Please call us if you have new issues that need to be addressed before your next appt.

## 2014-10-19 NOTE — Progress Notes (Signed)
Patient ID: Vincent ChampagneDaniel James Winters, male    DOB: 1970-09-08, 44 y.o.   MRN: [id number]  HPI Comments: Vincent Winters is a 44 y.o. very pleasant male patient who presents for evaluation of left-sided chest pain radiating up to his shoulder.  He reports symptoms started back in October 2015 with abdominal pain. At that time he reports having workup for his symptoms including abdominal imaging, EGD.  Later in October he developed chest pain on the left radiating up to his shoulder. Symptoms got worse after he was re-tiling his bathroom. Holding the tiles, symptoms were severe on the left Since then his symptoms have come and gone, typically at rest , sometimes when sleeping. Could be daytime or nighttime. Possibly better after eating though he is unsure. Also possibly better after taking deep breaths Symptoms can last for hours at a time.  He is not working out at the gym recently Sometimes symptom radiating into his left armpit, possibly some reproducible discomfort with deep palpation  He does her prior history of asthma but feels this is stable  exercise tolerance test in November 2015, this showed no ischemia/EKG changes  EKG on today's visit shows normal sinus rhythm with rate 69 bpm, no significant ST or T-wave changes    No Known Allergies  Outpatient Encounter Prescriptions as of 10/19/2014  Medication Sig  . albuterol (PROVENTIL HFA;VENTOLIN HFA) 108 (90 BASE) MCG/ACT inhaler Inhale 2 puffs into the lungs every 6 (six) hours as needed.  . fluticasone (FLOVENT HFA) 110 MCG/ACT inhaler Inhale 1 puff into the lungs 2 (two) times daily.  Marland Kitchen. ibuprofen (ADVIL,MOTRIN) 200 MG tablet Take 400 mg by mouth every 6 (six) hours as needed.    . triamcinolone cream (KENALOG) 0.1 % Apply 1 application topically 2 (two) times daily.  . [DISCONTINUED] hydrochlorothiazide (HYDRODIURIL) 25 MG tablet Take 1 tablet (25 mg total) by mouth daily.  . [DISCONTINUED] omeprazole (PRILOSEC) 20 MG  capsule Take 20 mg by mouth daily.    Marland Kitchen. amLODipine (NORVASC) 10 MG tablet Take 1 tablet (10 mg total) by mouth daily.  . naproxen (NAPROSYN) 500 MG tablet Take 1 tablet (500 mg total) by mouth 2 (two) times daily as needed.  Marland Kitchen. omeprazole (PRILOSEC) 20 MG capsule Take 1 capsule (20 mg total) by mouth 2 (two) times daily before a meal.  . [DISCONTINUED] hyoscyamine (LEVSIN SL) 0.125 MG SL tablet     Past Medical History  Diagnosis Date  . Asthma   . GERD (gastroesophageal reflux disease)   . Hiatal hernia   . Hypertension 09/07/2009    Qualifier: Diagnosis of  By: Patsy Lageropland MD, Karleen HampshireSpencer      Past Surgical History  Procedure Laterality Date  . Appendectomy      Social History  reports that he has never smoked. He has never used smokeless tobacco. He reports that he drinks alcohol. He reports that he does not use illicit drugs.  Family History family history includes Hypertension in his father.   Review of Systems  Constitutional: Negative.   Respiratory: Positive for chest tightness.   Cardiovascular: Negative.   Gastrointestinal: Negative.   Endocrine: Negative.   Musculoskeletal: Negative.        Left shoulder pain into armpit  Skin: Negative.   Neurological: Negative.   Hematological: Negative.   Psychiatric/Behavioral: Negative.   All other systems reviewed and are negative.   BP 140/100 mmHg  Pulse 69  Ht 5\' 8"  (1.727 m)  Wt 191 lb 1.9 oz (86.691  kg)  BMI 29.07 kg/m2  Physical Exam  Constitutional: He is oriented to person, place, and time. He appears well-developed and well-nourished.  HENT:  Head: Normocephalic.  Nose: Nose normal.  Mouth/Throat: Oropharynx is clear and moist.  Eyes: Conjunctivae are normal. Pupils are equal, round, and reactive to light.  Neck: Normal range of motion. Neck supple. No JVD present.  Cardiovascular: Normal rate, regular rhythm, S1 normal, S2 normal, normal heart sounds and intact distal pulses.  Exam reveals no gallop and no  friction rub.   No murmur heard. Pulmonary/Chest: Effort normal and breath sounds normal. No respiratory distress. He has no wheezes. He has no rales. He exhibits no tenderness.  Abdominal: Soft. Bowel sounds are normal. He exhibits no distension. There is no tenderness.  Musculoskeletal: Normal range of motion. He exhibits no edema or tenderness.  Lymphadenopathy:    He has no cervical adenopathy.  Neurological: He is alert and oriented to person, place, and time. Coordination normal.  Skin: Skin is warm and dry. No rash noted. No erythema.  Psychiatric: He has a normal mood and affect. His behavior is normal. Judgment and thought content normal.      Assessment and Plan   Nursing note and vitals reviewed.

## 2014-10-19 NOTE — Assessment & Plan Note (Signed)
He is concerning that HCTZ could be causing his symptoms has abdominal pain started after HCTZ initiated. Suggested he hold the HCTZ, start amlodipine 10 mg daily. Blood pressure has been running high on the HCTZ, 150 systolic on a regular basis at home

## 2014-10-19 NOTE — Assessment & Plan Note (Signed)
Atypical chest pain, likely musculoskeletal in nature. Suggested he try Aleve. Previously was trying Tylenol or low-dose aspirin. Also recommended he increase his omeprazole up to 20 minute grams twice a day while taking Aleve. Suggested he avoid heavy lifting on the left side. Suggested he consider coronary calcium score of sx persists.

## 2014-10-19 NOTE — Assessment & Plan Note (Signed)
Stable on omeprazole. 

## 2014-10-19 NOTE — Assessment & Plan Note (Signed)
He reports this is stable

## 2014-10-26 ENCOUNTER — Ambulatory Visit: Payer: 59 | Admitting: Family Medicine

## 2015-01-25 ENCOUNTER — Encounter: Payer: Self-pay | Admitting: Family Medicine

## 2015-01-25 ENCOUNTER — Ambulatory Visit (INDEPENDENT_AMBULATORY_CARE_PROVIDER_SITE_OTHER): Payer: 59 | Admitting: Family Medicine

## 2015-01-25 VITALS — BP 120/80 | HR 88 | Temp 98.6°F | Ht 68.0 in | Wt 198.5 lb

## 2015-01-25 DIAGNOSIS — N410 Acute prostatitis: Secondary | ICD-10-CM

## 2015-01-25 DIAGNOSIS — L200 Besnier's prurigo: Secondary | ICD-10-CM

## 2015-01-25 DIAGNOSIS — L239 Allergic contact dermatitis, unspecified cause: Secondary | ICD-10-CM

## 2015-01-25 MED ORDER — SULFAMETHOXAZOLE-TRIMETHOPRIM 800-160 MG PO TABS: 1.0000 | ORAL_TABLET | Freq: Two times a day (BID) | ORAL | Status: DC

## 2015-01-25 MED ORDER — CLOBETASOL PROPIONATE 0.05 % EX OINT: 1.0000 "application " | TOPICAL_OINTMENT | Freq: Two times a day (BID) | CUTANEOUS | Status: DC

## 2015-01-25 NOTE — Progress Notes (Signed)
Dr. Karleen HampshireSpencer T. Blakeleigh Domek, MD, CAQ Sports [facility] Primary [facility] and Sports Medicine [address] [place], [address] Phone: [phone] Fax: [phone]  01/25/2015  Patient: Vincent ChampagneDaniel James Winters, MRN: [id number], DOB: 1970/12/27, 44 y.o.  Primary Physician:  Hannah BeatSpencer Denae Zulueta, MD  Chief Complaint: Groin Pain and Insect Bite  Subjective:   Vincent ChampagneDaniel James Lichtenberger is a 44 y.o. very pleasant male patient who presents with the following:  Pain in groin, deep to testicles Hernia or groin pull for several weeks. Sometimes will come and go. Today has been so so  No injury or pulled muscle at all He does not seem to feel a bulge at all.  Prostatitis  L hand rash, allergic derm. A few small vesicular lesions that are quite itchy  Past Medical History, Surgical History, Social History, Family History, Problem List, Medications, and Allergies have been reviewed and updated if relevant.   GEN: No acute illnesses, no fevers, chills. GI: No n/v/d, eating normally Pulm: No SOB Interactive and getting along well at home.  Otherwise, ROS is as per the HPI.  Objective:   BP 120/80 mmHg  Pulse 88  Temp(Src) 98.6 F (37 C) (Oral)  Ht 5\' 8"  (1.727 m)  Wt 198 lb 8 oz (90.039 kg)  BMI 30.19 kg/m2  GEN: WDWN, NAD, Non-toxic, A & O x 3 HEENT: Atraumatic, Normocephalic. Neck supple. No masses, No LAD. Ears and Nose: No external deformity. CV: RRR, No M/G/R. No JVD. No thrill. No extra heart sounds. PULM: CTA B, no wheezes, crackles, rhonchi. No retractions. No resp. distress. No accessory muscle use. EXTR: No c/c/e NEURO Normal gait.  PSYCH: Normally interactive. Conversant. Not depressed or anxious appearing.  Calm demeanor.  GU: normal male, normal penis, testicles NT. No hernia.  Skin: 3-4 small vesicles on L wrist  Laboratory and Imaging Data:  Assessment and Plan:   Prostatitis, acute  Allergic dermatitis  Most likely prostatis and treat as such  Temovate for wrist  - more likely exposure rather than bug bite by appearance  Follow-up: No Follow-up on file.  New Prescriptions   CLOBETASOL OINTMENT (TEMOVATE) 0.05 %    Apply 1 application topically 2 (two) times daily.   SULFAMETHOXAZOLE-TRIMETHOPRIM (BACTRIM DS,SEPTRA DS) 800-160 MG PER TABLET    Take 1 tablet by mouth 2 (two) times daily.   No orders of the defined types were placed in this encounter.    Signed,  Elpidio GaleaSpencer T. Labria Wos, MD   Patient's Medications  New Prescriptions   CLOBETASOL OINTMENT (TEMOVATE) 0.05 %    Apply 1 application topically 2 (two) times daily.   SULFAMETHOXAZOLE-TRIMETHOPRIM (BACTRIM DS,SEPTRA DS) 800-160 MG PER TABLET    Take 1 tablet by mouth 2 (two) times daily.  Previous Medications   ALBUTEROL (PROVENTIL HFA;VENTOLIN HFA) 108 (90 BASE) MCG/ACT INHALER    Inhale 2 puffs into the lungs every 6 (six) hours as needed.   AMLODIPINE (NORVASC) 10 MG TABLET    Take 1 tablet (10 mg total) by mouth daily.   FLUTICASONE (FLOVENT HFA) 110 MCG/ACT INHALER    Inhale 1 puff into the lungs 2 (two) times daily.   IBUPROFEN (ADVIL,MOTRIN) 200 MG TABLET    Take 400 mg by mouth every 6 (six) hours as needed.     NAPROXEN (NAPROSYN) 500 MG TABLET    Take 1 tablet (500 mg total) by mouth 2 (two) times daily as needed.   OMEPRAZOLE (PRILOSEC) 20 MG CAPSULE    Take 1 capsule (20 mg  total) by mouth 2 (two) times daily before a meal.   TRIAMCINOLONE CREAM (KENALOG) 0.1 %    Apply 1 application topically 2 (two) times daily.  Modified Medications   No medications on file  Discontinued Medications   No medications on file

## 2015-01-25 NOTE — Progress Notes (Signed)
Pre visit review using our clinic review tool, if applicable. No additional management support is needed unless otherwise documented below in the visit note. 

## 2015-03-08 ENCOUNTER — Encounter: Payer: Self-pay | Admitting: Family Medicine

## 2015-03-08 ENCOUNTER — Ambulatory Visit (INDEPENDENT_AMBULATORY_CARE_PROVIDER_SITE_OTHER)
Admission: RE | Admit: 2015-03-08 | Discharge: 2015-03-08 | Disposition: A | Discharge status: Home / Self Care | Payer: 59 | Source: Ambulatory Visit | Attending: Family Medicine | Admitting: Family Medicine

## 2015-03-08 ENCOUNTER — Ambulatory Visit
Admission: RE | Admit: 2015-03-08 | Discharge: 2015-03-08 | Disposition: A | Discharge status: Home / Self Care | Payer: 59 | Source: Ambulatory Visit | Attending: Family Medicine | Admitting: Family Medicine

## 2015-03-08 ENCOUNTER — Ambulatory Visit (INDEPENDENT_AMBULATORY_CARE_PROVIDER_SITE_OTHER): Payer: 59 | Admitting: Family Medicine

## 2015-03-08 VITALS — BP 125/80 | HR 94 | Temp 97.7°F | Ht 68.0 in | Wt 196.5 lb

## 2015-03-08 DIAGNOSIS — M79645 Pain in left finger(s): Secondary | ICD-10-CM | POA: Diagnosis not present

## 2015-03-08 NOTE — Progress Notes (Signed)
Dr. Karleen Hampshire T. Jerron Niblack, MD, CA[facility] Sports Medicine Primary [facility] and Sports Medicine [address] [place], [address] Phone: [phone] Fax: [phone]  03/08/2015  Patient: Vincent Winters, MRN: [id number], DOB: [birth year], 44 y.o.  Primary Physician:  Hannah Beat, MD  Chief Complaint: Ankle Injury  Subjective:   Javelle Donigan is a 44 y.o. very pleasant male patient who presents with the following:  L 3rd and 4th, hurt fingers and slipped while fly-fishing. Went down and hands went down to catch him. He had his fingers, worse on the left at the third and fourth digits.  These been swelling some and hard to move.  Past Medical History, Surgical History, Social History, Family History, Problem List, Medications, and Allergies have been reviewed and updated if relevant.  Patient Active Problem List   Diagnosis Date Noted  . Left sided chest pain 10/19/2014  . 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 Hypertension 09/07/2009  . Asthma 09/23/2008  . GERD 09/23/2008  . HIATAL HERNIA 09/23/2008    Past Medical History  Diagnosis Date  . Asthma   . GERD (gastroesophageal reflux disease)   . Hiatal hernia   . Hypertension 09/07/2009    Qualifier: Diagnosis of  By: Patsy Lager MD, Karleen Hampshire      Past Surgical History  Procedure Laterality Date  . Appendectomy      History   Social History  . Marital Status: Single    Spouse Name: N/A  . Number of Children: N/A  . 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 Smoking status: Never Smoker   . Smokeless tobacco: Never Used  . Alcohol Use: Yes  . Drug Use: No  . Sexual Activity: Not on file   Other Topics Concern  . Not on file   Social History Narrative   Regular exercise-yes      From [place]    Family History  Problem Relation Age of Onset  . Hypertension Father     No Known Allergies  Medication list reviewed and  updated in full in [facility].  GEN: No fevers, chills. Nontoxic. Primarily MSK c/o today. MSK: Detailed in the HPI GI: tolerating PO intake without difficulty Neuro: No numbness, parasthesias, or tingling associated. Otherwise the pertinent positives of the ROS are noted above.   Objective:   BP 125/80 mmHg  Pulse 94  Temp(Src) 97.7 F (36.5 C) (Oral)  Ht  (1.727 m)  Wt 196 lb 8 oz (89.132 kg)  BMI 29.88 kg/m2   GEN: WDWN, NAD, Non-toxic, Alert & Oriented x 3 HEENT: Atraumatic, Normocephalic.  Ears and Nose: No external deformity. EXTR: No clubbing/cyanosis/edema NEURO: Normal gait.  PSYCH: Normally interactive. Conversant. Not depressed or anxious appearing.  Calm demeanor.     The DIP and PIP joints are mildly tender to palpation on the middle finger and ring finger on the left.  There is some pain with stressing the medial and lateral collateral ligaments on each of these joints.  The ligaments appear stable.  There is also some restriction of flexion in both of these joints.  All other bony anatomy is nontender throughout the wrist and hand on the left.  Radiology: Dg Finger Middle Left  03/08/2015   CLINICAL DATA:  Fall.  Pain.  EXAM: LEFT MIDDLE FINGER 2+V  COMPARISON:  None.  FINDINGS: Subtle lucency is noted the base of the middle phalanx  of the left fourth digit. is most likely a vascular channel. No displaced fracture is noted. No radiopaque foreign body.  IMPRESSION: No acute abnormality.   Electronically Signed   By: Maisie Fushomas  Register   On: 03/08/2015 16:38   Dg Finger Ring Left  03/08/2015   CLINICAL DATA:  Pain for 6 days.  Fall.  EXAM: LEFT RING FINGER 2+V  COMPARISON:  None.  FINDINGS: No acute bony or joint abnormality identified. No evidence of fracture or dislocation.  IMPRESSION: No acute abnormality.   Electronically Signed   By: Maisie Fushomas  Register   On: 03/08/2015 16:39     Assessment and Plan:   Finger pain, left - Plan: DG Finger Middle Left, DG  Finger Ring Left  Ligamentous sprain, more at the PIP and less at the DIP joints of the third and fourth digits on the left.  I don't think he really needs to do much of anything and these will improve.  Ice or NSAIDs as needed.  Orders Placed This Encounter  Procedures  . DG Finger Middle Left  . DG Finger Ring Left    Signed,  Giana Castner T. Lareen Mullings, MD   Patient's Medications  New Prescriptions   No medications on file  Previous Medications   ALBUTEROL (PROVENTIL HFA;VENTOLIN HFA) 108 (90 BASE) MCG/ACT INHALER    Inhale 2 puffs into the lungs every 6 (six) hours as needed.   AMLODIPINE (NORVASC) 10 MG TABLET    Take 1 tablet (10 mg total) by mouth daily.   CLOBETASOL OINTMENT (TEMOVATE) 0.05 %    Apply 1 application topically 2 (two) times daily.   FLUTICASONE (FLOVENT HFA) 110 MCG/ACT INHALER    Inhale 1 puff into the lungs 2 (two) times daily.   IBUPROFEN (ADVIL,MOTRIN) 200 MG TABLET    Take 400 mg by mouth every 6 (six) hours as needed.     NAPROXEN (NAPROSYN) 500 MG TABLET    Take 1 tablet (500 mg total) by mouth 2 (two) times daily as needed.   OMEPRAZOLE (PRILOSEC) 20 MG CAPSULE    Take 1 capsule (20 mg total) by mouth 2 (two) times daily before a meal.   TRIAMCINOLONE CREAM (KENALOG) 0.1 %    Apply 1 application topically 2 (two) times daily.  Modified Medications   No medications on file  Discontinued Medications   SULFAMETHOXAZOLE-TRIMETHOPRIM (BACTRIM DS,SEPTRA DS) 800-160 MG PER TABLET    Take 1 tablet by mouth 2 (two) times daily.

## 2015-03-08 NOTE — Progress Notes (Signed)
Pre visit review using our clinic review tool, if applicable. No additional management support is needed unless otherwise documented below in the visit note. 

## 2015-05-22 ENCOUNTER — Other Ambulatory Visit: Payer: Self-pay | Admitting: Cardiovascular Disease

## 2015-09-21 ENCOUNTER — Other Ambulatory Visit: Payer: Self-pay | Admitting: Cardiovascular Disease

## 2015-10-18 ENCOUNTER — Ambulatory Visit (INDEPENDENT_AMBULATORY_CARE_PROVIDER_SITE_OTHER): Payer: 59 | Admitting: Family Medicine

## 2015-10-18 ENCOUNTER — Encounter: Payer: Self-pay | Admitting: Family Medicine

## 2015-10-18 ENCOUNTER — Ambulatory Visit (INDEPENDENT_AMBULATORY_CARE_PROVIDER_SITE_OTHER)
Admission: RE | Admit: 2015-10-18 | Discharge: 2015-10-18 | Disposition: A | Discharge status: Home / Self Care | Payer: 59 | Source: Ambulatory Visit | Attending: Family Medicine | Admitting: Family Medicine

## 2015-10-18 VITALS — BP 112/70 | HR 77 | Temp 98.6°F | Ht 68.0 in | Wt 191.0 lb

## 2015-10-18 DIAGNOSIS — M5010 Cervical disc disorder with radiculopathy, unspecified cervical region: Secondary | ICD-10-CM | POA: Diagnosis not present

## 2015-10-18 DIAGNOSIS — J4521 Mild intermittent asthma with (acute) exacerbation: Secondary | ICD-10-CM

## 2015-10-18 MED ORDER — PREDNISONE 20 MG PO TABS: ORAL_TABLET | ORAL | Status: DC

## 2015-10-18 NOTE — Progress Notes (Signed)
Dr. Karleen Hampshire T. Layth Cerezo, MD, CA[facility] Sports Medicine Primary [facility] and Sports Medicine [address] [place], [address] Phone: [phone] Fax: [phone]  10/18/2015  Patient: Vincent Winters, MRN: [id number], DOB: 1970/12/30, 45 y.o.  Primary Physician:  Hannah Beat, MD   Chief Complaint  Patient presents with  . Cough    Dry x 1 week  . Shoulder Pain    Left   Subjective:   Vincent Winters is a 45 y.o. very pleasant male patient who presents with the following:  Cough for a week without being sick.  Will start wheezing. Then coughing.  He has not been sick, and he does have a known history of asthma.  He tried some albuterol, which is not sure has helped.  He is not sure if he has been using his albuterol or his Flovent on an as-needed basis, and possibly could've been using these incorrectly.  No fever, chills, sweats, nausea snows, or other acute illness symptoms.  He also continues to have the pain that he describes in his shoulder but is in more of anterior chest wall and also inferior to the axilla..  He also has some occasional in the shoulder blade, and sometimes it'll have some and his arm.  We have worked this up fairly significantly in the past based on when it is had symptoms at varying times.  Cardiology was not impressed and did not think that this was coming from a cardiac source.  He also had an EGD previously which was normal and complete benign.  Shoulder pathology has always been effectively negative during his exam.   Past Medical History, Surgical History, Social History, Family History, Problem List, Medications, and Allergies have been reviewed and updated if relevant.  Patient Active Problem List   Diagnosis Date Noted  . Left sided chest pain 10/19/2014  . 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 Hypertension 09/07/2009  . Asthma 09/23/2008  . GERD 09/23/2008  . HIATAL HERNIA 09/23/2008    Past Medical History    Diagnosis Date  . Asthma   . GERD (gastroesophageal reflux disease)   . Hiatal hernia   . Hypertension 09/07/2009    Qualifier: Diagnosis of  By: Patsy Lager MD, Karleen Hampshire      Past Surgical History  Procedure Laterality Date  . Appendectomy      Social History   Social History  . Marital Status: Single    Spouse Name: N/A  . Number of Children: N/A  . 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 Smoking status: Never Smoker   . Smokeless tobacco: Never Used  . Alcohol Use: Yes  . Drug Use: No  . Sexual Activity: Not on file   Other Topics Concern  . Not on file   Social History Narrative   Regular exercise-yes      From [place]    Family History  Problem Relation Age of Onset  . Hypertension Father     No Known Allergies  Medication list reviewed and updated in full in [facility].   GEN: No acute illnesses, no fevers, chills. GI: No n/v/d, eating normally Pulm: No SOB Interactive and getting along well at home.  Otherwise, ROS is as per the HPI.  Objective:   BP 112/70 mmHg  Pulse 77  Temp(Src) 98.6 F (37 C) (Oral)  Ht 5\' 8"  (1.727 m)  Wt 191 lb (  86.637 kg)  BMI 29.05 kg/m2  SpO2 97%  GEN: WDWN, NAD, Non-toxic, A & O x 3 HEENT: Atraumatic, Normocephalic. Neck supple. No masses, No LAD. Ears and Nose: No external deformity. CV: RRR, No M/G/R. No JVD. No thrill. No extra heart sounds. PULM: CTA B, no wheezes, crackles, rhonchi. No retractions. No resp. distress. No accessory muscle use. EXTR: No c/c/e NEURO Normal gait.  PSYCH: Normally interactive. Conversant. Not depressed or anxious appearing.  Calm demeanor.   Shoulder: L Inspection: No muscle wasting or winging Ecchymosis/edema: neg  AC joint, scapula, clavicle: NT Cervical spine: NT, full ROM Spurling's: neg Abduction: full, 5/5 Flexion: full, 5/5 IR, full, lift-off: 5/5 ER at neutral: full, 5/5 AC crossover and  compression: neg Neer: neg Hawkins: neg Drop Test: neg Empty Can: neg Supraspinatus insertion: NT Bicipital groove: NT Speed's: neg Yergason's: neg Sulcus sign: neg Scapular dyskinesis: none C5-T1 intact Sensation intact Grip 5/5   Laboratory and Imaging Data: Dg Cervical Spine Complete  10/19/2015  CLINICAL DATA:  Neck pain with radicular symptoms EXAM: CERVICAL SPINE - COMPLETE 4+ VIEW COMPARISON:  None in PACs FINDINGS: The cervical vertebral bodies are preserved in height. The disc space heights are well maintained. There is no spondylolisthesis. The facet joints appear normal. No bony encroachment upon the neural foramina is observed. The odontoid is intact. IMPRESSION: There is no acute or significant chronic bony abnormality of the cervical spine. If there are true radicular symptoms, cervical spine MRI may be useful. Electronically Signed   By: David  [place] M.D.   On: 10/19/2015 07:55     Assessment and Plan:   Asthma with exacerbation, mild intermittent  Cervical disc disorder with radiculopathy of cervical region - Plan: DG Cervical Spine Complete  Mild asthma exacerbation.  Continue with inhalers and add of doses some prednisone for the next 10 days.  This pain that he is having, I'm not entirely sure of the etiology.  I think it is most likely coming from a nerve encroachment.  Plain films were basically normal. Think MRI would likely be helpful, but the patient is currently  Out of work, so we're going to hold this until is more financially feasible.  Certainly possible that the burst of steroids may help this as well.  I do not think that this is coming from an intra-articular or rotator cuff shoulder pathology.  Follow-up: No Follow-up on file.  New Prescriptions   PREDNISONE (DELTASONE) 20 MG TABLET    2 tabs po for 5 days, then 1 tab po for 5 days   Modified Medications   No medications on file   Orders Placed This Encounter  Procedures  . DG Cervical Spine  Complete    Signed,  Victorya Hillman T. Joshiah Traynham, MD   Patient's Medications  New Prescriptions   PREDNISONE (DELTASONE) 20 MG TABLET    2 tabs po for 5 days, then 1 tab po for 5 days  Previous Medications   ALBUTEROL (PROVENTIL HFA;VENTOLIN HFA) 108 (90 BASE) MCG/ACT INHALER    Inhale 2 puffs into the lungs every 6 (six) hours as needed.   AMLODIPINE (NORVASC) 10 MG TABLET    TAKE 1 TABLET BY MOUTH DAILY   IBUPROFEN (ADVIL,MOTRIN) 200 MG TABLET    Take 400 mg by mouth every 6 (six) hours as needed.     NAPROXEN (NAPROSYN) 500 MG TABLET    Take 1 tablet (500 mg total) by mouth 2 (two) times daily as needed.   OMEPRAZOLE (PRILOSEC) 20 MG  CAPSULE    Take 1 capsule (20 mg total) by mouth 2 (two) times daily before a meal.   TRIAMCINOLONE CREAM (KENALOG) 0.1 %    Apply 1 application topically 2 (two) times daily.  Modified Medications   No medications on file  Discontinued Medications   CLOBETASOL OINTMENT (TEMOVATE) 0.05 %    Apply 1 application topically 2 (two) times daily.   FLUTICASONE (FLOVENT HFA) 110 MCG/ACT INHALER    Inhale 1 puff into the lungs 2 (two) times daily.

## 2015-10-18 NOTE — Patient Instructions (Signed)
Foam roller

## 2015-10-18 NOTE — Progress Notes (Signed)
Pre visit review using our clinic review tool, if applicable. No additional management support is needed unless otherwise documented below in the visit note. 

## 2015-10-19 ENCOUNTER — Telehealth: Payer: Self-pay | Admitting: Family Medicine

## 2015-10-19 NOTE — Telephone Encounter (Signed)
Neck x-ray results discussed with Reuel Boom via telephone.  See result note.

## 2015-10-19 NOTE — Telephone Encounter (Signed)
Pt returned your call.  

## 2015-10-26 ENCOUNTER — Other Ambulatory Visit: Payer: Self-pay | Admitting: Cardiovascular Disease

## 2015-11-30 ENCOUNTER — Other Ambulatory Visit: Payer: Self-pay | Admitting: Cardiovascular Disease

## 2016-03-06 ENCOUNTER — Other Ambulatory Visit: Payer: 59

## 2016-03-13 ENCOUNTER — Encounter: Payer: 59 | Admitting: Family Medicine

## 2016-03-25 ENCOUNTER — Other Ambulatory Visit: Payer: Self-pay | Admitting: Cardiovascular Disease

## 2016-03-25 NOTE — Telephone Encounter (Signed)
Patient needs a follow up appointment with Dr. Gollan.  

## 2016-05-07 ENCOUNTER — Ambulatory Visit (INDEPENDENT_AMBULATORY_CARE_PROVIDER_SITE_OTHER): Payer: Managed Care, Other (non HMO) | Admitting: Internal Medicine

## 2016-05-07 ENCOUNTER — Encounter: Payer: Self-pay | Admitting: Internal Medicine

## 2016-05-07 VITALS — BP 124/80 | HR 71 | Temp 98.1°F | Wt 192.0 lb

## 2016-05-07 DIAGNOSIS — H00014 Hordeolum externum left upper eyelid: Secondary | ICD-10-CM | POA: Diagnosis not present

## 2016-05-07 NOTE — Patient Instructions (Signed)
Stye A stye is a bump on your eyelid caused by a bacterial infection. A stye can form inside the eyelid (internal stye) or outside the eyelid (external stye). An internal stye may be caused by an infected oil-producing gland inside your eyelid. An external stye may be caused by an infection at the base of your eyelash (hair follicle). Styes are very common. Anyone can get them at any age. They usually occur in just one eye, but you may have more than one in either eye.  CAUSES  The infection is almost always caused by bacteria called Staphylococcus aureus. This is a common type of bacteria that lives on your skin. RISK FACTORS You may be at higher risk for a stye if you have had one before. You may also be at higher risk if you have:  Diabetes.  Long-term illness.  Long-term eye redness.  A skin condition called seborrhea.  High fat levels in your blood (lipids). SIGNS AND SYMPTOMS  Eyelid pain is the most common symptom of a stye. Internal styes are more painful than external styes. Other signs and symptoms may include:  Painful swelling of your eyelid.  A scratchy feeling in your eye.  Tearing and redness of your eye.  Pus draining from the stye. DIAGNOSIS  Your health care provider may be able to diagnose a stye just by examining your eye. The health care provider may also check to make sure:  You do not have a fever or other signs of a more serious infection.  The infection has not spread to other parts of your eye or areas around your eye. TREATMENT  Most styes will clear up in a few days without treatment. In some cases, you may need to use antibiotic drops or ointment to prevent infection. Your health care provider may have to drain the stye surgically if your stye is:  Large.  Causing a lot of pain.  Interfering with your vision. This can be done using a thin blade or a needle.  HOME CARE INSTRUCTIONS   Take medicines only as directed by your health care  provider.  Apply a clean, warm compress to your eye for 10 minutes, 4 times a day.  Do not wear contact lenses or eye makeup until your stye has healed.  Do not try to pop or drain the stye. SEEK MEDICAL CARE IF:  You have chills or a fever.  Your stye does not go away after several days.  Your stye affects your vision.  Your eyeball becomes swollen, red, or painful. MAKE SURE YOU:  Understand these instructions.  Will watch your condition.  Will get help right away if you are not doing well or get worse.   This information is not intended to replace advice given to you by your health care provider. Make sure you discuss any questions you have with your health care provider.   Document Released: 05/22/2005 Document Revised: 09/02/2014 Document Reviewed: 11/26/2013 Elsevier Interactive Patient Education 2016 Elsevier Inc.  

## 2016-05-07 NOTE — Progress Notes (Signed)
Subjective:    Patient ID: Vincent Winters, male    DOB: 1971-03-30, 45 y.o.   MRN: [id number]  HPI  Pt presents to the clinic today with c/o his left eyelid being swollen and red. He noticed this yesterday. He reports associated dryness and a burning sensation of his eyelid. He denies visual changes. He has not tried anything OTC for this. He denies any trauma to the left eye.  Review of Systems      Past Medical History:  Diagnosis Date  . Asthma   . GERD (gastroesophageal reflux disease)   . Hiatal hernia   . Hypertension 09/07/2009   Qualifier: Diagnosis of  By: Patsy Lageropland MD, Karleen HampshireSpencer      Current Outpatient Prescriptions  Medication Sig Dispense Refill  . albuterol (PROVENTIL HFA;VENTOLIN HFA) 108 (90 BASE) MCG/ACT inhaler Inhale 2 puffs into the lungs every 6 (six) hours as needed. 3 Inhaler 1  . amLODipine (NORVASC) 10 MG tablet TAKE 1 TABLET BY MOUTH DAILY 30 tablet 1  . ibuprofen (ADVIL,MOTRIN) 200 MG tablet Take 400 mg by mouth every 6 (six) hours as needed.      . naproxen (NAPROSYN) 500 MG tablet Take 1 tablet (500 mg total) by mouth 2 (two) times daily as needed. (Patient not taking: Reported on 03/08/2015) 60 tablet 1  . omeprazole (PRILOSEC) 20 MG capsule Take 1 capsule (20 mg total) by mouth 2 (two) times daily before a meal. 60 capsule 6  . predniSONE (DELTASONE) 20 MG tablet 2 tabs po for 5 days, then 1 tab po for 5 days 15 tablet 0  . triamcinolone cream (KENALOG) 0.1 % Apply 1 application topically 2 (two) times daily. 454 g 1   No current facility-administered medications for this visit.     No Known Allergies  Family History  Problem Relation Age of Onset  . Hypertension Father     Social History   Social History  . Marital status: Single    Spouse name: N/A  . Number of children: N/A  . 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 Smoking status: Never Smoker  . Smokeless  tobacco: Never Used  . Alcohol use Yes  . Drug use: No  . Sexual activity: Not on file   Other Topics Concern  . Not on file   Social History Narrative   Regular exercise-yes      [place]     Constitutional: Denies fever, malaise, fatigue, headache or abrupt weight changes.  HEENT: Pt reports eye redness and swelling. Denies ear pain, ringing in the ears, wax buildup, runny nose, nasal congestion, bloody nose, or sore throat.   No other specific complaints in a complete review of systems (except as listed in HPI above).  Objective:   Physical Exam   BP 124/80   Pulse 71   Temp 98.1 F (36.7 C) (Oral)   Wt 192 lb (87.1 kg)   SpO2 98%   BMI 29.19 kg/m  Wt Readings from Last 3 Encounters:  05/07/16 192 lb (87.1 kg)  10/18/15 191 lb (86.6 kg)  03/08/15 196 lb 8 oz (89.1 kg)    General: Appears his stated age, well developed, well nourished in NAD. HEENT: Head: normal shape and size; Eyes: sclera white, no icterus, conjunctiva pink. Stye noted of left upper eyelid.    BMET    Component Value Date/Time   NA 136 04/29/2014 1747  K 3.8 04/29/2014 1747   CL 104 04/29/2014 1747   CO2 25 04/29/2014 1747   GLUCOSE 87 04/29/2014 1747   BUN 11 04/29/2014 1747   CREATININE 1.37 (H) 04/29/2014 1747   CALCIUM 8.5 04/29/2014 1747   GFRNONAA >60 04/29/2014 1747   GFRAA >60 04/29/2014 1747    Lipid Panel  No results found for: CHOL, TRIG, HDL, CHOLHDL, VLDL, LDLCALC  CBC    Component Value Date/Time   WBC 7.0 04/29/2014 1747   WBC 7.9 04/28/2014 0904   RBC 5.09 04/29/2014 1747   RBC 5.12 04/28/2014 0904   HGB 15.7 04/29/2014 1747   HCT 46.5 04/29/2014 1747   PLT 197 04/29/2014 1747   MCV 91 04/29/2014 1747   MCH 30.8 04/29/2014 1747   MCHC 33.7 04/29/2014 1747   MCHC 33.6 04/28/2014 0904   RDW 13.0 04/29/2014 1747   LYMPHSABS 1.9 04/29/2014 1747   MONOABS 0.6 04/29/2014 1747   EOSABS 0.1 04/29/2014 1747   BASOSABS 0.1 04/29/2014 1747    Hgb  A1C No results found for: HGBA1C         Assessment & Plan:   Stye, left upper eyelid:  Antiinflammatories for pain Warm compress for 10 minutes TID Can use Saline solution to moisten the eyes Return precautions given  RTC as needed or if symptoms persist or worsen BAITY, REGINA, NP

## 2016-05-08 IMAGING — CR DG CHEST 2V
1 series · 3 of 3 positions shown · non-contrast
Comparison: None.

CLINICAL DATA: 43-year-old male left chest pain extending lateral
to the left nipple. Initial encounter.

EXAM:
CHEST  2 VIEW

[Series 1: w chest pa · 0.14mm/px · 3 of 3 slices shown]
[im 1/3]
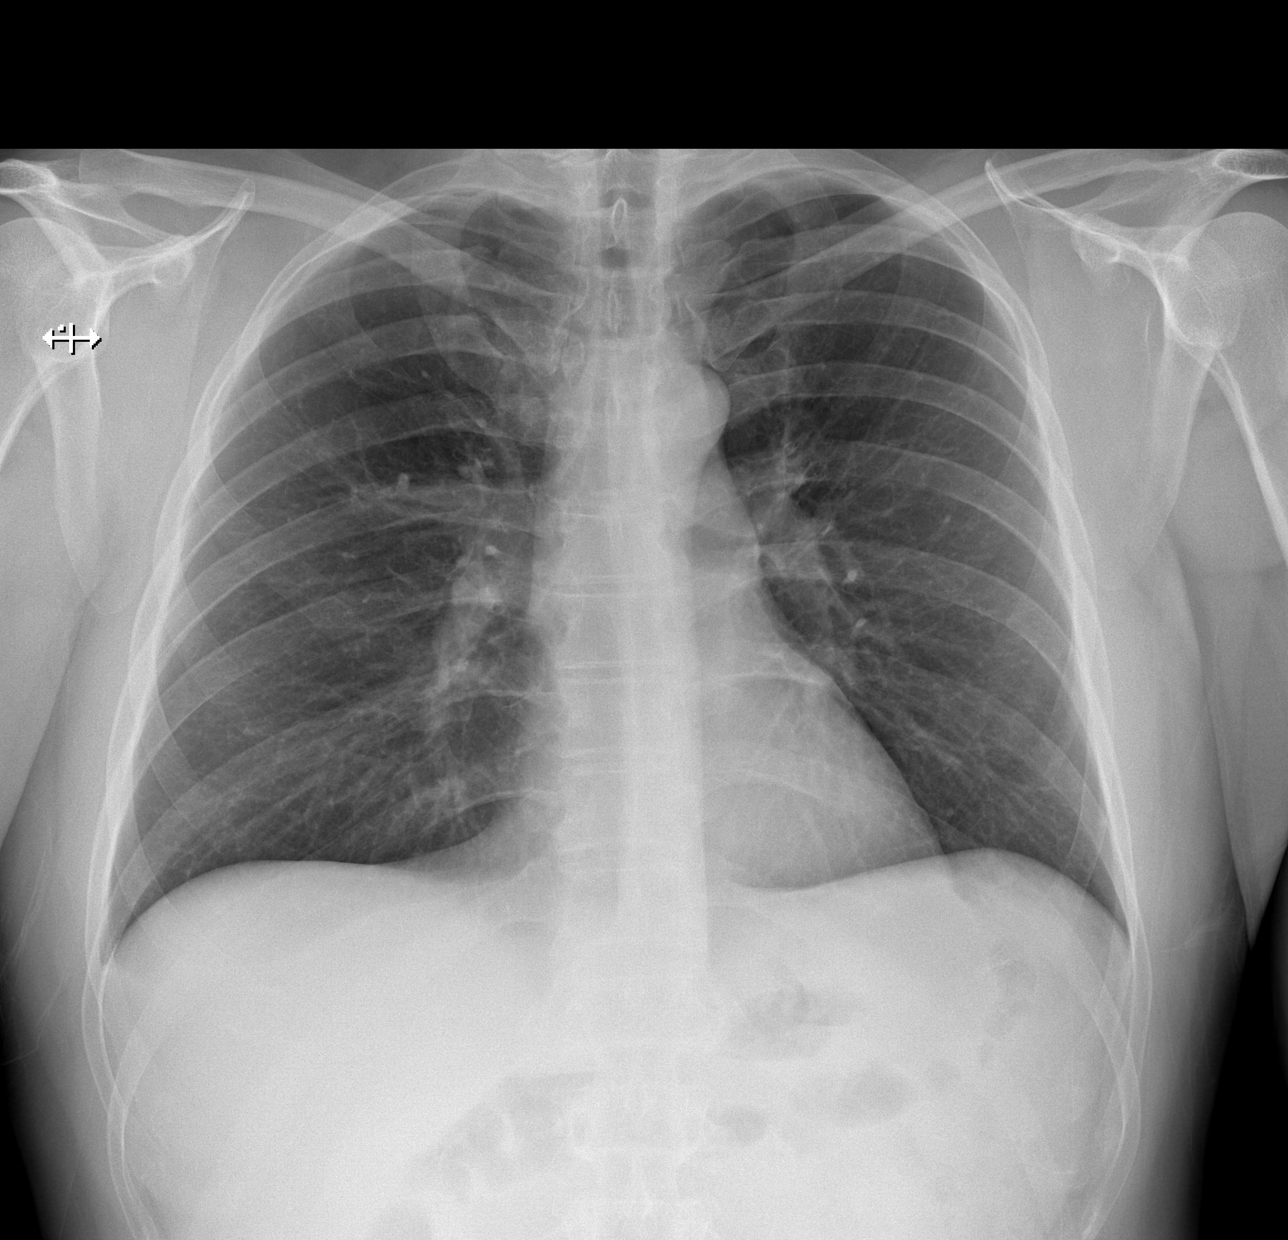
[im 2/3]
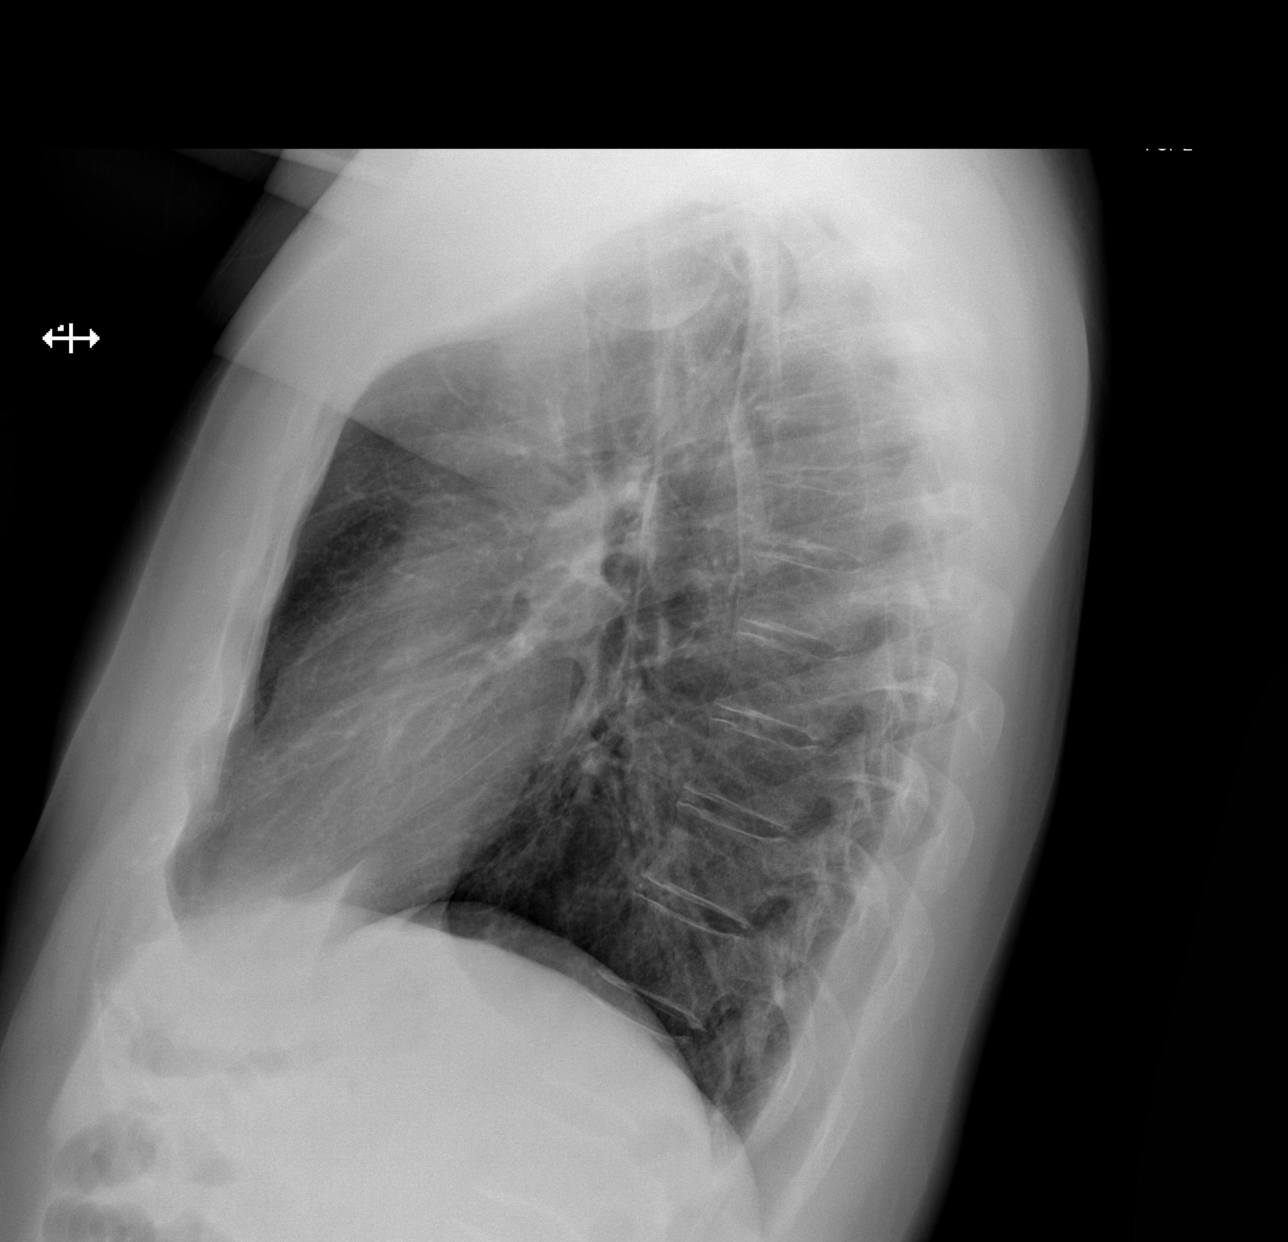
[im 3/3]
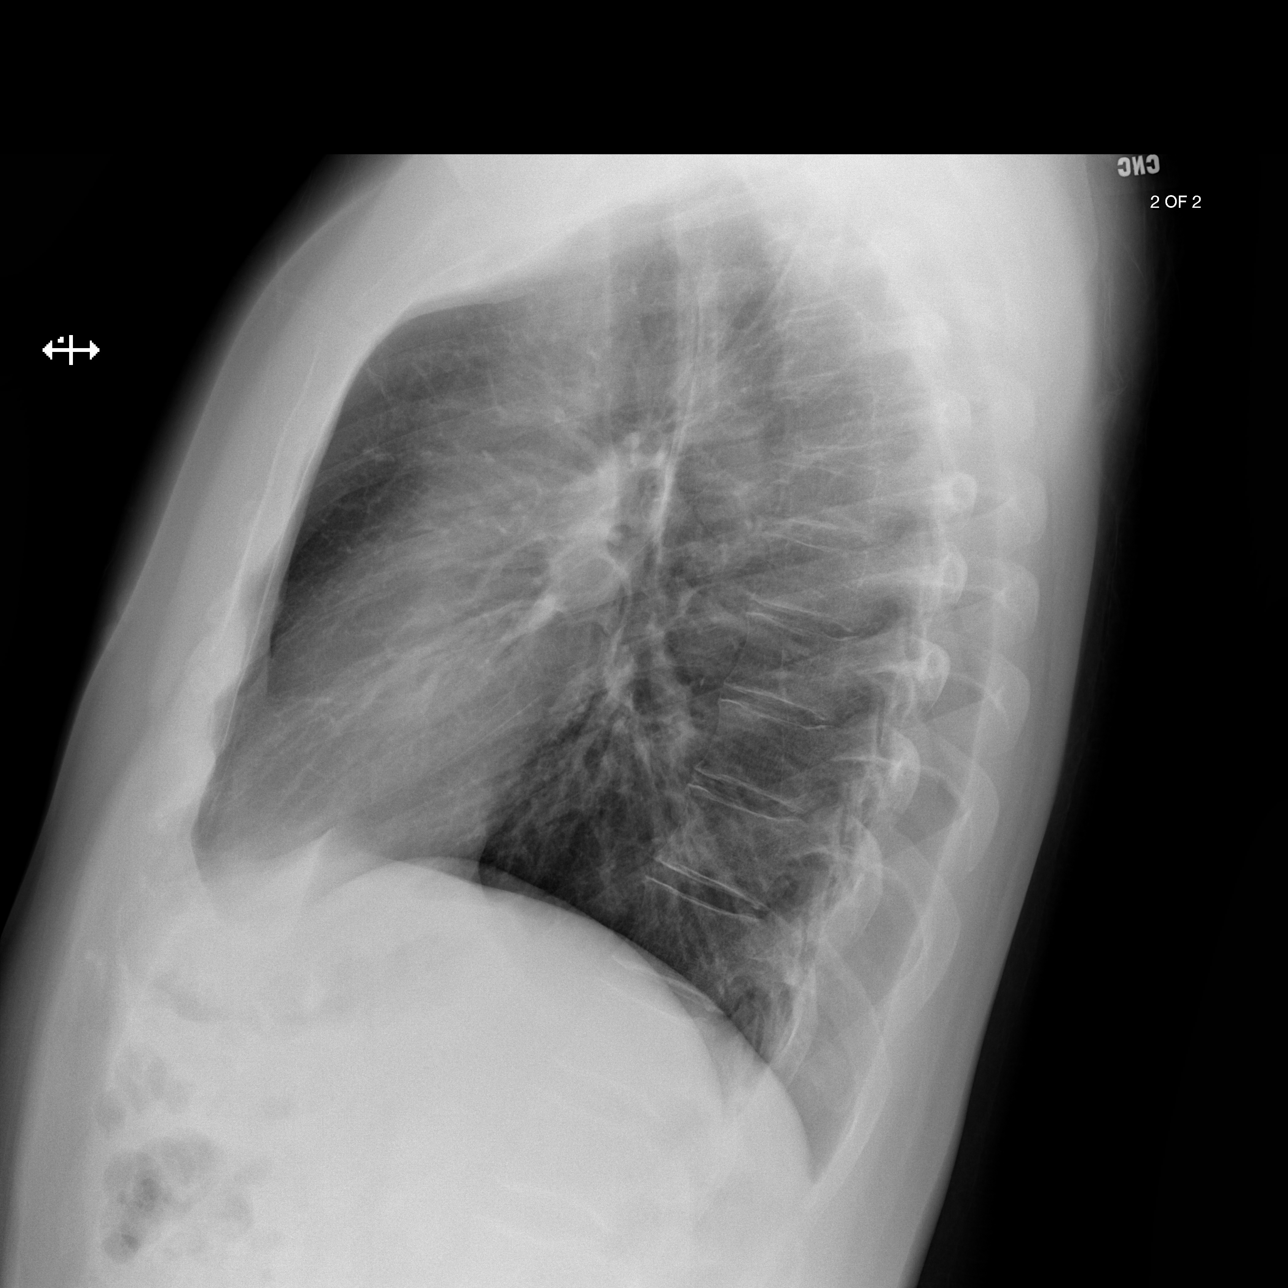

[3 of 3 positions shown; findings below may reference images not displayed]

FINDINGS: Lung volumes are within normal limits. Normal cardiac size and
mediastinal contours. Visualized tracheal air column is within
normal limits. No pneumothorax or pulmonary edema. No consolidation.
Questionable small right pleural effusion, only visible on the
frontal view. No confluent pulmonary opacity. No acute osseous
abnormality identified.
IMPRESSION: Negative except for possible small right pleural effusion.

## 2016-05-28 IMAGING — US ABDOMEN ULTRASOUND
1 series · 14 of 25 positions shown · non-contrast
Comparison: None.

CLINICAL DATA: Abdominal and right flank and upper quadrant pain

EXAM:
ULTRASOUND ABDOMEN COMPLETE

[Series 1: abdomen ultrasound · 0.25mm/px · 14 of 99 slices shown]
[im 1/99]
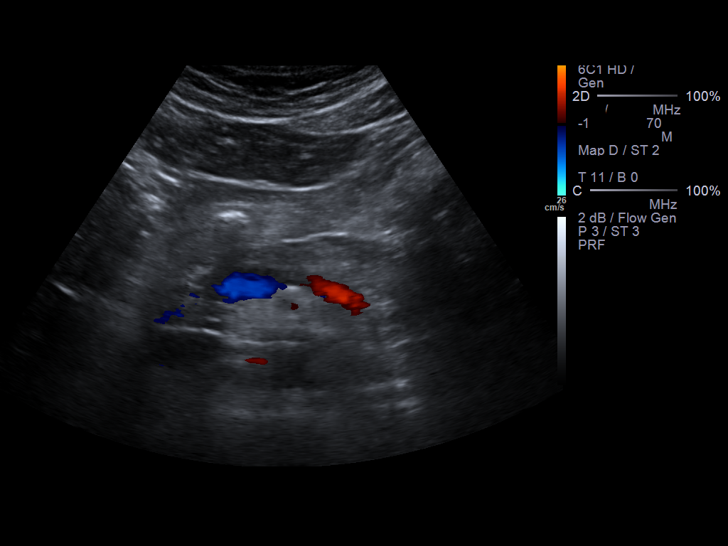
[im 9/99]
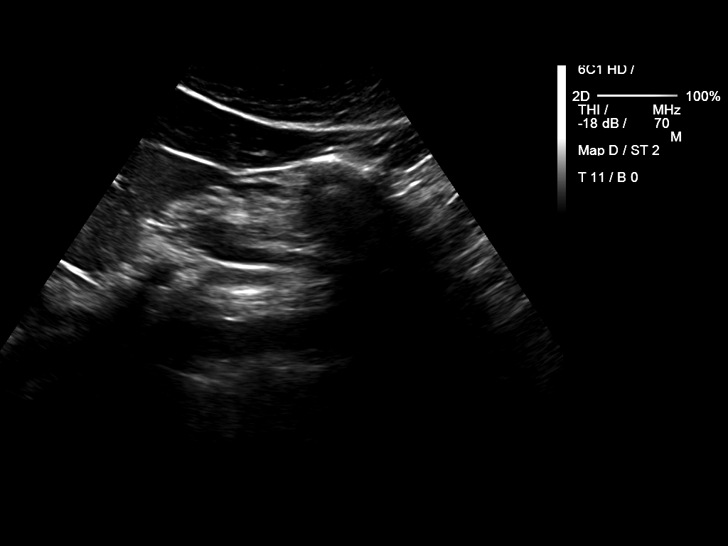
[im 17/99]
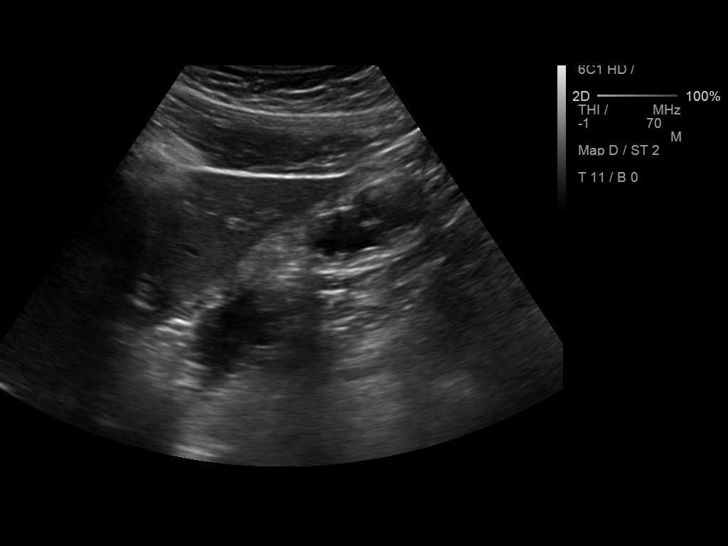
[im 25/99]
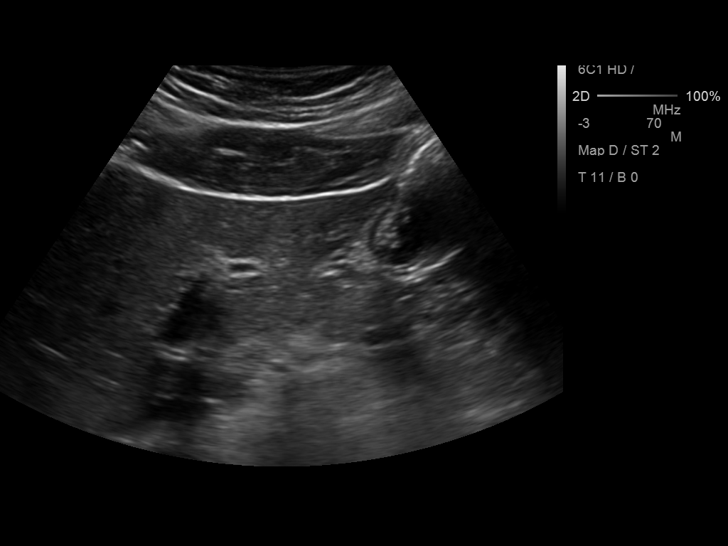
[im 33/99]
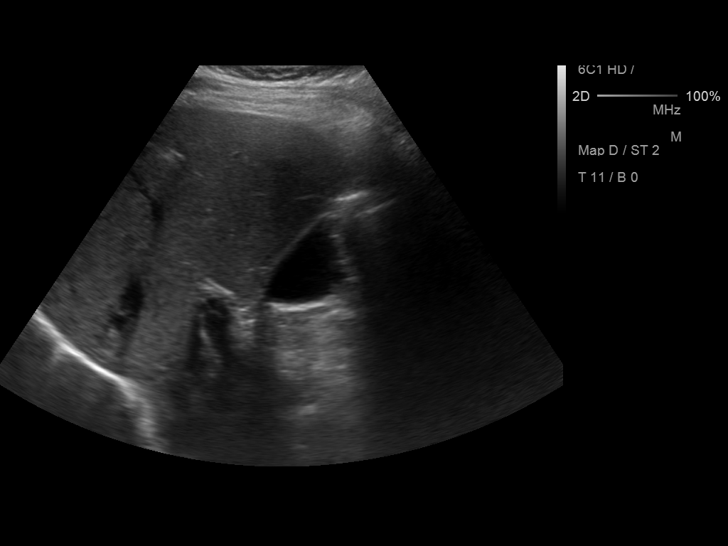
[im 37/99]
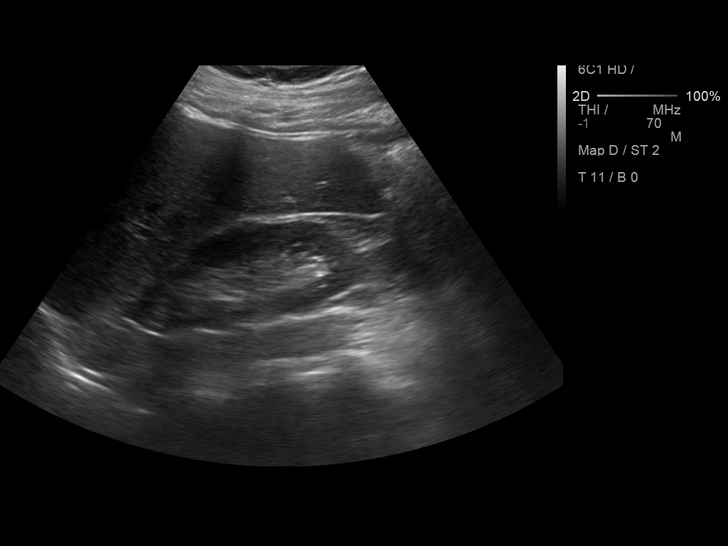
[im 45/99]
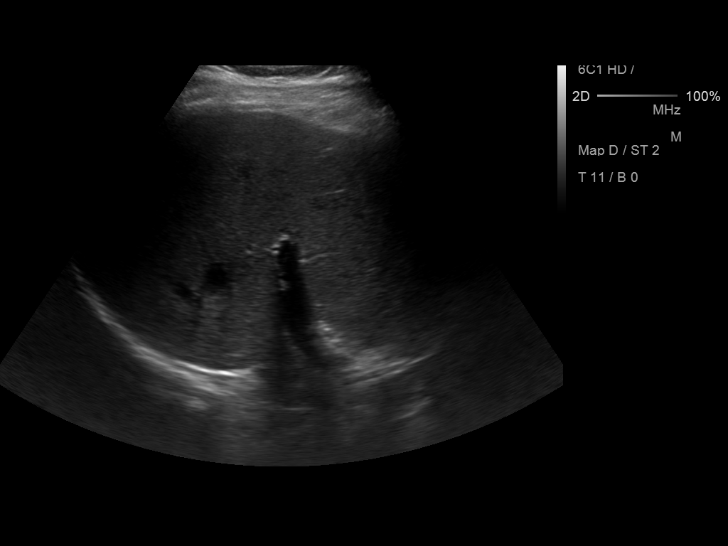
[im 54/99]
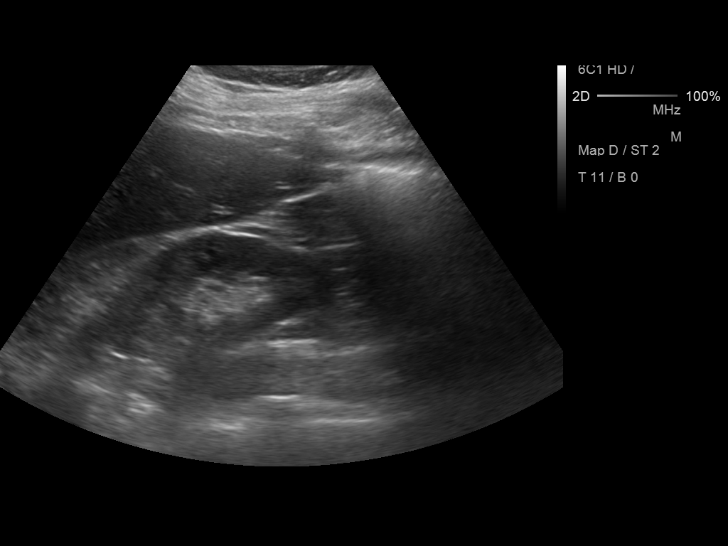
[im 62/99]
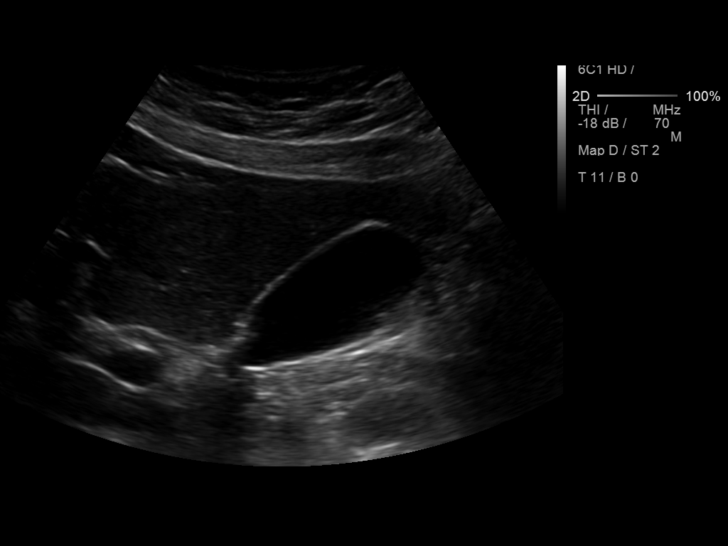
[im 66/99]
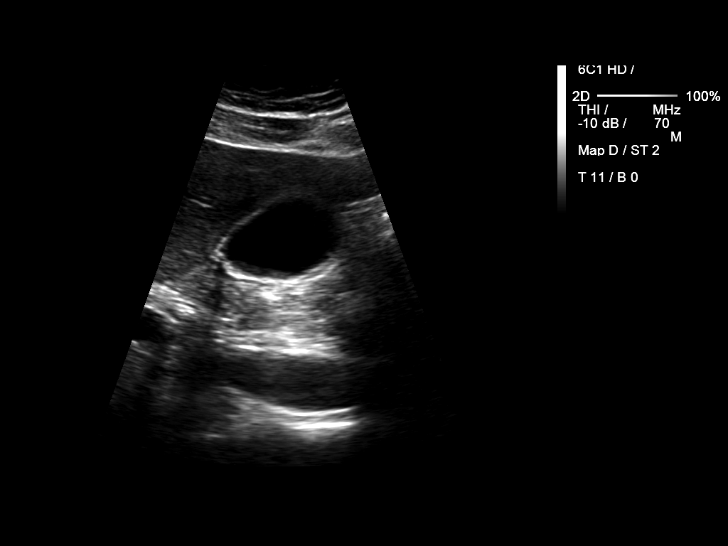
[im 74/99]
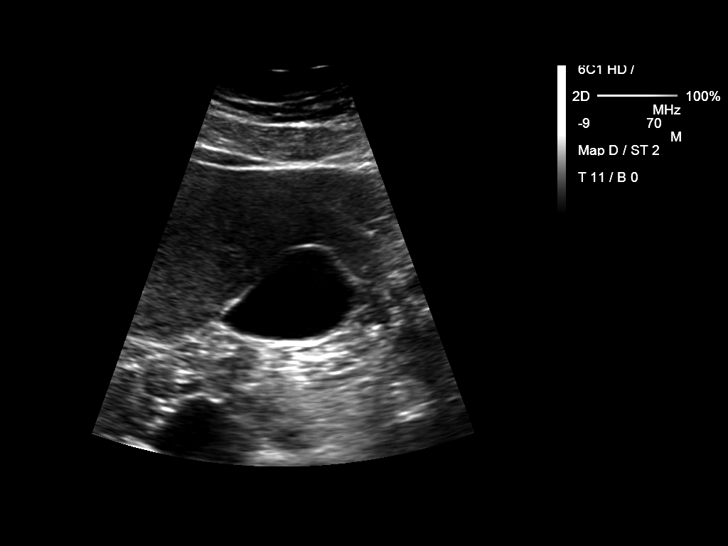
[im 82/99]
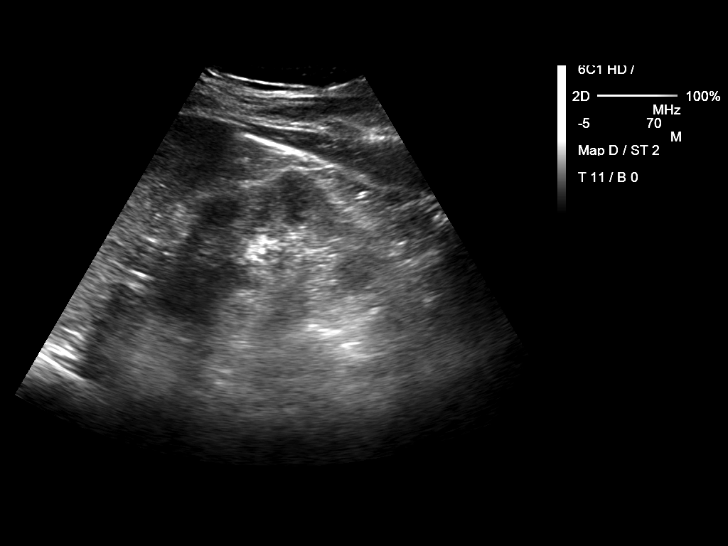
[im 90/99]
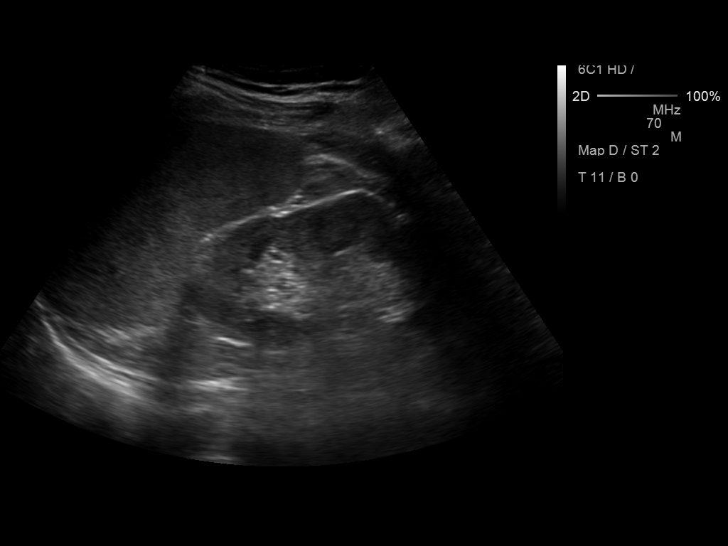
[im 99/99]
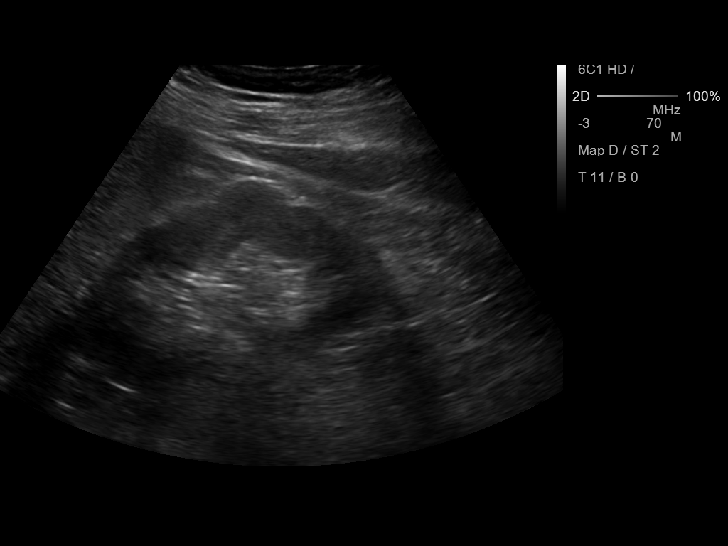

[14 of 25 positions shown; findings below may reference images not displayed]

FINDINGS: Gallbladder:

No gallstones or wall thickening visualized. No sonographic Murphy
sign noted.

Common bile duct:

Diameter: 3.7 mm

Liver:

No focal lesion identified. Within normal limits in parenchymal
echogenicity.

IVC:

No abnormality visualized.

Pancreas:

Visualized portion unremarkable.

Spleen:

Size and appearance within normal limits.

Right Kidney:

Length: 11 cm. Echogenicity within normal limits. No mass or
hydronephrosis visualized.

Left Kidney:

Length: 10.3 cm. Echogenicity within normal limits. A focal bulge
along the lateral aspect of the midpole cortex is isoechoic with
adjacent cortex and likely reflects a dromedary hump.

Abdominal aorta:

No aneurysm visualized.

Other findings:

None.
IMPRESSION: Normal abdominal ultrasound examination.

## 2016-05-29 IMAGING — CR DG CHEST 2V
1 series · 2 of 2 positions shown · non-contrast
Comparison: 04/08/2014

CLINICAL DATA: Chest/right upper quadrant pain, asthma

EXAM:
CHEST  2 VIEW

[Series 1: dxr chest pa (or ap) and lateral · 0.14mm/px · 2 of 2 slices shown]
[im 1/2]
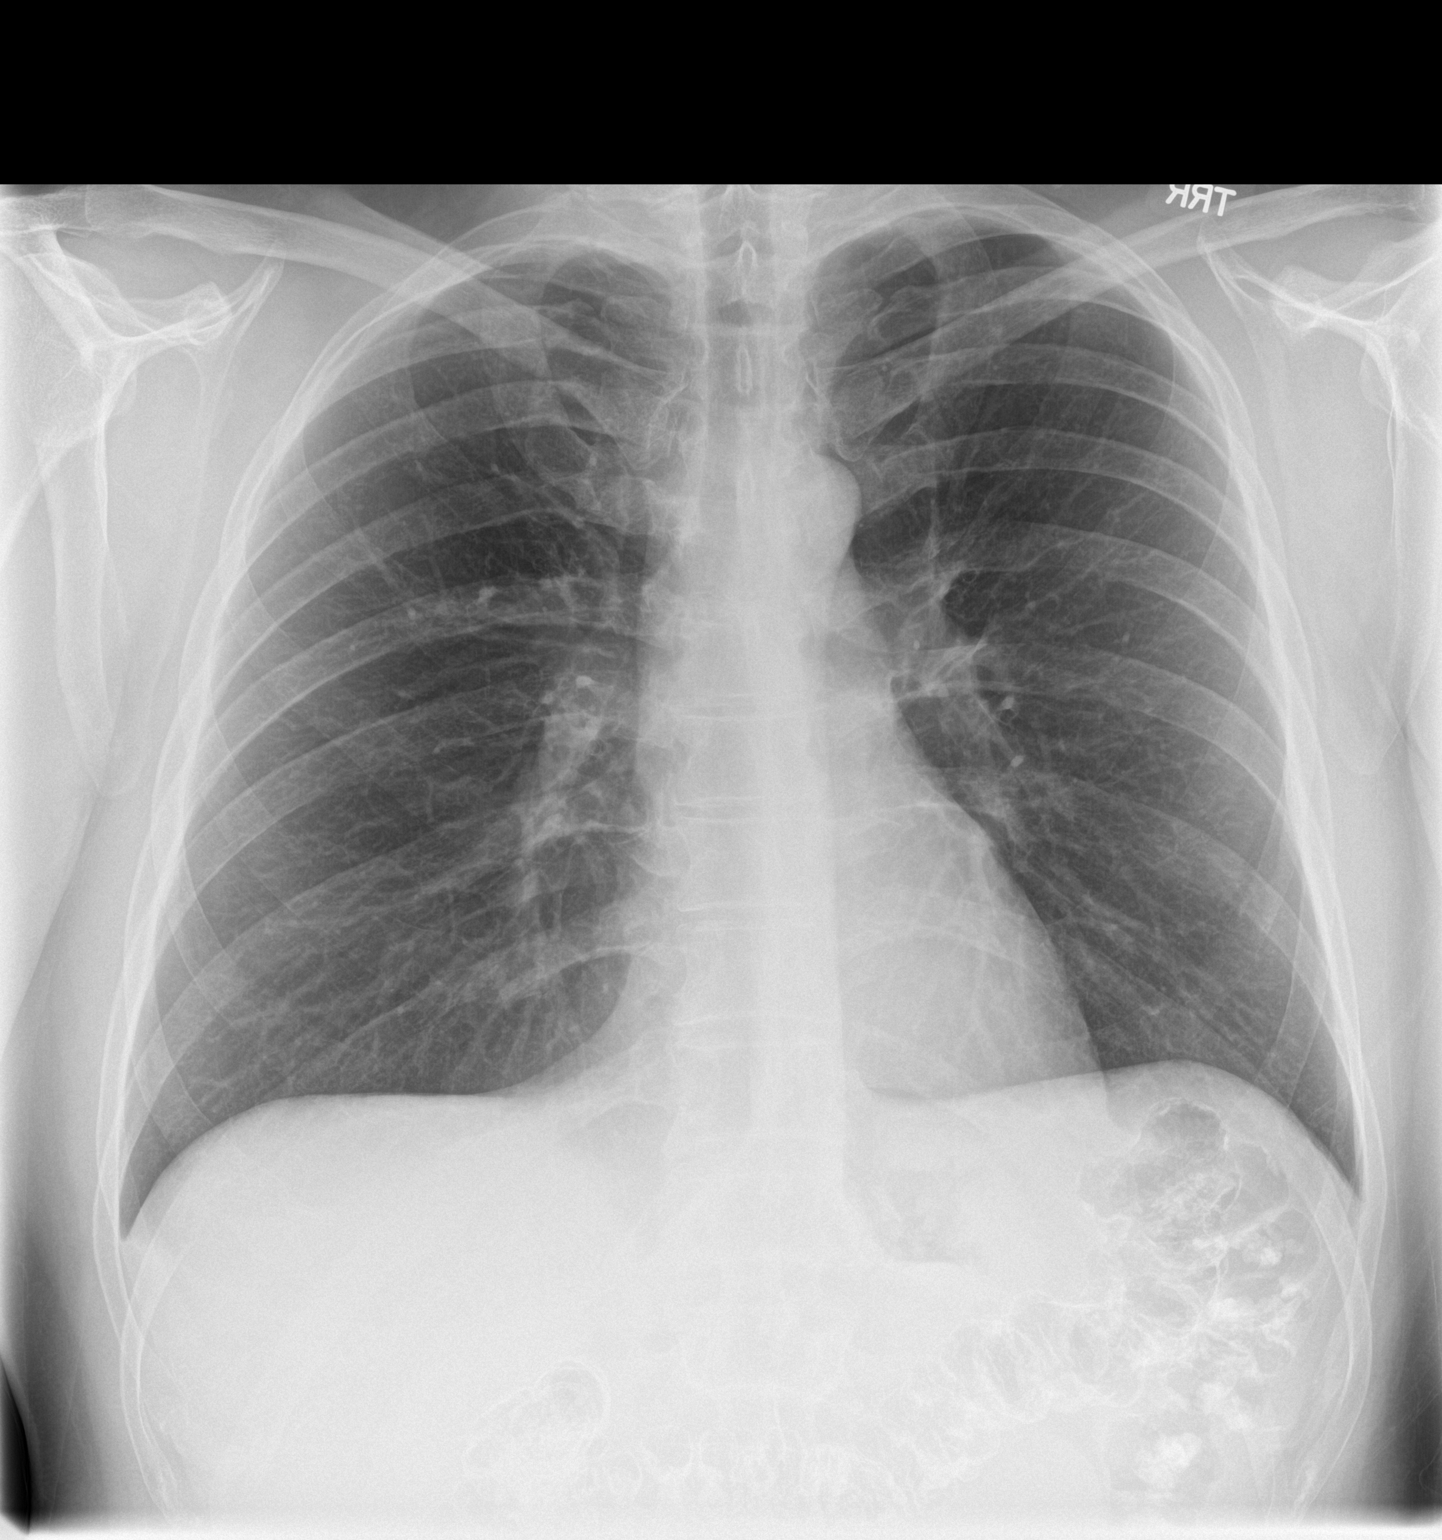
[im 2/2]
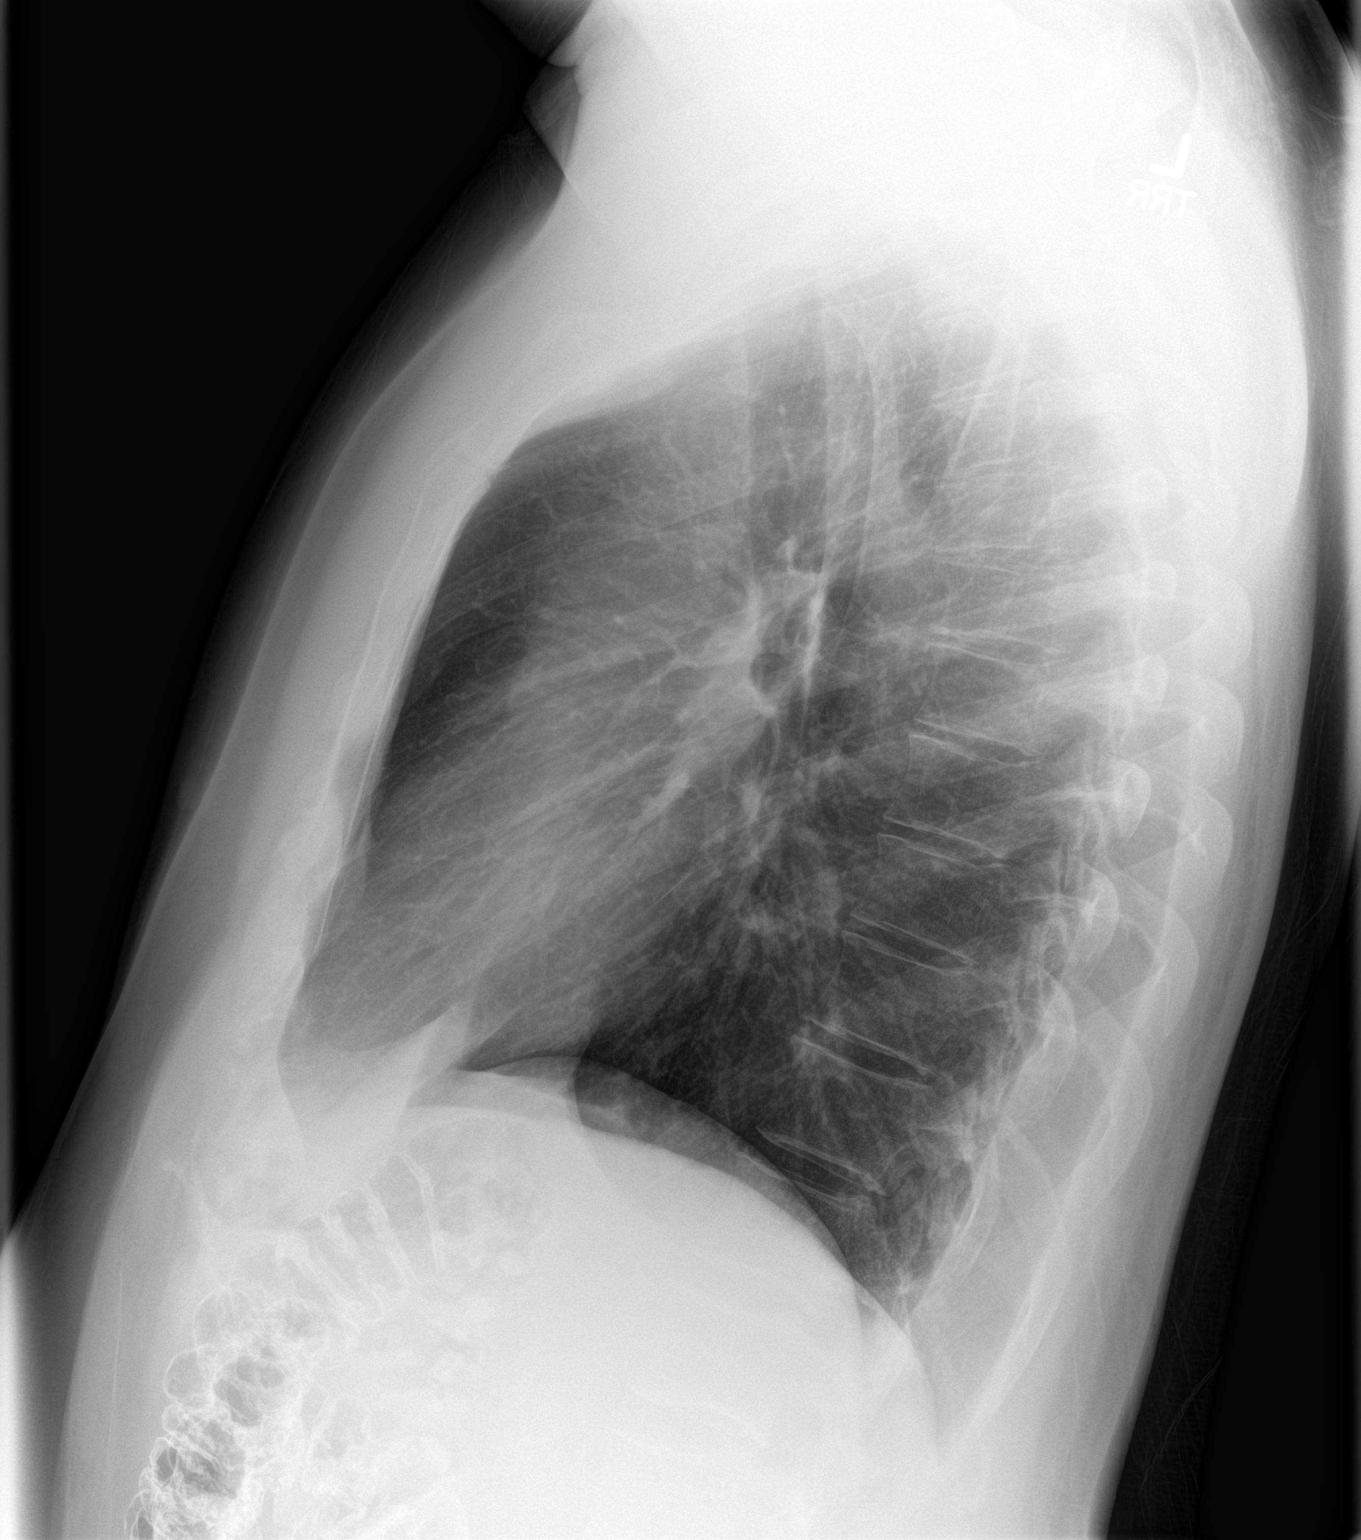

[2 of 2 positions shown; findings below may reference images not displayed]

FINDINGS: Lungs are essentially clear. No focal consolidation. No pleural
effusion or pneumothorax.

The heart is normal in size.

Visualized osseous structures are within normal limits.
IMPRESSION: No active cardiopulmonary disease.

## 2016-06-04 ENCOUNTER — Other Ambulatory Visit: Payer: Self-pay | Admitting: Cardiovascular Disease

## 2016-06-10 ENCOUNTER — Ambulatory Visit (INDEPENDENT_AMBULATORY_CARE_PROVIDER_SITE_OTHER): Payer: Managed Care, Other (non HMO) | Admitting: Family Medicine

## 2016-06-10 ENCOUNTER — Encounter: Payer: Self-pay | Admitting: Family Medicine

## 2016-06-10 VITALS — BP 120/92 | HR 70 | Temp 98.8°F | Ht 68.0 in | Wt 188.5 lb

## 2016-06-10 DIAGNOSIS — M7502 Adhesive capsulitis of left shoulder: Secondary | ICD-10-CM

## 2016-06-10 DIAGNOSIS — I10 Essential (primary) hypertension: Secondary | ICD-10-CM

## 2016-06-10 DIAGNOSIS — Z23 Encounter for immunization: Secondary | ICD-10-CM

## 2016-06-10 MED ORDER — AMLODIPINE BESYLATE 10 MG PO TABS: 10.0000 mg | ORAL_TABLET | Freq: Every day | ORAL | 0 refills | Status: DC

## 2016-06-10 MED ORDER — METHYLPREDNISOLONE ACETATE 40 MG/ML IJ SUSP
80.0000 mg | Freq: Once | INTRAMUSCULAR | Status: AC
  Administered 2016-06-10: 80 mg via INTRA_ARTICULAR

## 2016-06-10 MED ORDER — AMLODIPINE BESYLATE 10 MG PO TABS: 10.0000 mg | ORAL_TABLET | Freq: Every day | ORAL | 3 refills | Status: DC

## 2016-06-10 NOTE — Progress Notes (Signed)
Pre visit review using our clinic review tool, if applicable. No additional management support is needed unless otherwise documented below in the visit note. 

## 2016-06-10 NOTE — Progress Notes (Signed)
Dr. Karleen Hampshire T. Beza Steppe, MD, CAQ Sports Medicine Primary [facility] and Sports Medicine [address] [place], [address] Phone: [phone] Fax: [phone]  06/10/2016  Patient: Vincent Winters, MRN: [id number], DOB: [birth year], 45 y.o.  Primary Physician:  Hannah Beat, MD   Chief Complaint  Patient presents with  . Medication Refill  . Shoulder Pain    Left   Subjective:   Vincent Winters is a 45 y.o. very pleasant male patient who presents with the following: shoulder pain  The patient noted above presents with shoulder pain that has been ongoing for 2-3 mo - h/o R Ad Capsulitis a few years ago, now with full ROM there is no history of trauma or accident. The patient denies neck pain or radicular symptoms. No shoulder blade pain Denies dislocation, subluxation, separation of the shoulder. The patient does complain of pain with flexion, abduction, and terminal motion.  Significant restriction of motion. he describes a deep ache around the shoulder, and sometimes it will wake the patient up at night.  Now has lost range of motion in the left shoulder - intense pain in the left shoulder.  Antecubital fossa pain.  Has a history of frozen shoulder on the right.   No injury.   Medications Tried: tylenol and nsaids Ice or Heat: minimal help Tried PT: y in the past  Prior shoulder Injury: R frozen shoulder Prior surgery: No Prior fracture: No  Past Medical History, Surgical History, Social History, Family History, Medications, and allergies reviewed and updated if relevant.   Patient Active Problem List   Diagnosis Date Noted  . 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 Hypertension 09/07/2009  . Asthma 09/23/2008  . GERD 09/23/2008  . HIATAL HERNIA 09/23/2008    Past Medical History:  Diagnosis Date  . Asthma   . GERD (gastroesophageal reflux disease)   . Hiatal hernia   . Hypertension 09/07/2009   Qualifier: Diagnosis of  By:  Patsy Lager MD, Karleen Hampshire      Past Surgical History:  Procedure Laterality Date  . APPENDECTOMY      Social History   Social History  . Marital status: Single    Spouse name: N/A  . Number of children: N/A  . 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 Smoking status: Never Smoker  . Smokeless tobacco: Never Used  . Alcohol use Yes  . Drug use: No  . Sexual activity: Not on file   Other Topics Concern  . Not on file   Social History Narrative   Regular exercise-yes      From [place]    Family History  Problem Relation Age of Onset  . Hypertension Father     No Known Allergies  Medication list reviewed and updated in full in [facility].  GEN: No fevers, chills. Nontoxic. Primarily MSK c/o today. MSK: Detailed in the HPI GI: tolerating PO intake without difficulty Neuro: No numbness, parasthesias, or tingling associated. Otherwise the pertinent positives of the ROS are noted above.    Objective:   Blood pressure (!) 120/92, pulse 70, temperature 98.8 F (37.1 C), temperature source Oral, height 5\' 8"  (1.727 m), weight 188 lb 8 oz (85.5 kg).   GEN: WDWN, NAD, Non-toxic, Alert & Oriented x 3 HEENT: Atraumatic, Normocephalic.  Ears and Nose: No external deformity. EXTR: No clubbing/cyanosis/edema NEURO: Normal gait.  PSYCH: Normally interactive. Conversant. Not depressed  or anxious appearing.  Calm demeanor.   Shoulder: R and L Inspection: No muscle wasting or winging Ecchymosis/edema: neg  AC joint, scapula, clavicle: NT Cervical spine: NT, full ROM Spurling's: neg ABNORMAL SIDE TESTED: L UNLESS OTHERWISE NOTED, THE CONTRALATERAL SIDE HAS FULL RANGE OF MOTION. Abduction: 5/5, LIMITED TO 115 DEGREES Flexion: 5/5, LIMITED TO 120 DEGNO ROM  IR, lift-off: 5/5. TESTED AT 90 DEGREES OF ABDUCTION, LIMITED TO minimal DEGREES ER at neutral:  5/5, TESTED AT 90 DEGREES OF ABDUCTION, LIMITED TO 35  DEGREES AC crossover and compression: PAIN Drop Test: neg Empty Can: neg Supraspinatus insertion: NT Bicipital groove: NT ALL OTHER SPECIAL TESTING EQUIVOCAL GIVEN LOSS OF MOTION C5-T1 intact Sensation intact Grip 5/5    Assessment and Plan:   Adhesive capsulitis of left shoulder - Plan: methylPREDNISolone acetate (DEPO-MEDROL) injection 80 mg  Need for prophylactic vaccination and inoculation against influenza - Plan: Flu Vaccine QUAD 36+ mos IM  Essential hypertension  >25 minutes spent in face to face time with patient, >50% spent in counselling or coordination of care  Patient was given a systematic ROM protocol from [facility] or [facility] to be done daily. Emphasized importance of adherence, help of PT, daily HEP.  The average length of total symptoms is 12-18 months going through 3 different phases in the freezing and thawing process. Reviewed all with patient.   Tylenol or NSAID of choice prn for pain relief Intraarticular shoulder injections discussed with patient, which have good evidence for accelerating the thawing phase.  Patient will be sent for formal PT for aggressive frozen shoulder ROM - he has done before. Will need RTC str and scapular stabilization to fix underlying mechanics.  Intrarticular Shoulder Injection, L Verbal consent was obtained from the patient. Risks including infection explained and contrasted with benefits and alternatives. Patient prepped with Chloraprep and Ethyl Chloride used for anesthesia. An intraarticular shoulder injection was performed using the posterior approach. The patient tolerated the procedure well and had decreased pain post injection. No complications. Injection: 8 cc of Lidocaine 1% and 2 mL Depo-Medrol 40 mg. Needle: 22 gauge   Follow-up: 3 mo  Modified Medications   Modified Medication Previous Medication   AMLODIPINE (NORVASC) 10 MG TABLET amLODipine (NORVASC) 10 MG tablet      Take 1 tablet (10 mg total) by mouth daily.     TAKE 1 TABLET BY MOUTH DAILY   Orders Placed This Encounter  Procedures  . Flu Vaccine QUAD 36+ mos IM    Signed,  Asa Baudoin T. Hadiyah Maricle, MD   Patient's Medications  New Prescriptions   No medications on file  Previous Medications   ALBUTEROL (PROVENTIL HFA;VENTOLIN HFA) 108 (90 BASE) MCG/ACT INHALER    Inhale 2 puffs into the lungs every 6 (six) hours as needed.   IBUPROFEN (ADVIL,MOTRIN) 200 MG TABLET    Take 400 mg by mouth every 6 (six) hours as needed.     NAPROXEN (NAPROSYN) 500 MG TABLET    Take 1 tablet (500 mg total) by mouth 2 (two) times daily as needed.   OMEPRAZOLE (PRILOSEC) 20 MG CAPSULE    Take 1 capsule (20 mg total) by mouth 2 (two) times daily before a meal.   TRIAMCINOLONE CREAM (KENALOG) 0.1 %    Apply 1 application topically 2 (two) times daily.  Modified Medications   Modified Medication Previous Medication   AMLODIPINE (NORVASC) 10 MG TABLET amLODipine (NORVASC) 10 MG tablet      Take 1 tablet (10 mg total) by mouth  daily.    TAKE 1 TABLET BY MOUTH DAILY  Discontinued Medications   No medications on file

## 2016-08-28 ENCOUNTER — Encounter: Payer: Self-pay | Admitting: Family Medicine

## 2016-08-28 ENCOUNTER — Ambulatory Visit (INDEPENDENT_AMBULATORY_CARE_PROVIDER_SITE_OTHER): Payer: Managed Care, Other (non HMO) | Admitting: Family Medicine

## 2016-08-28 VITALS — BP 131/83 | HR 69 | Temp 98.4°F | Ht 67.25 in | Wt 185.2 lb

## 2016-08-28 DIAGNOSIS — R5383 Other fatigue: Secondary | ICD-10-CM | POA: Diagnosis not present

## 2016-08-28 DIAGNOSIS — Z23 Encounter for immunization: Secondary | ICD-10-CM

## 2016-08-28 DIAGNOSIS — Z13220 Encounter for screening for lipoid disorders: Secondary | ICD-10-CM

## 2016-08-28 DIAGNOSIS — Z0000 Encounter for general adult medical examination without abnormal findings: Secondary | ICD-10-CM | POA: Diagnosis not present

## 2016-08-28 LAB — HEPATIC FUNCTION PANEL
ALT: 26 U/L (ref 0–53)
AST: 17 U/L (ref 0–37)
Albumin: 4.4 g/dL (ref 3.5–5.2)
Alkaline Phosphatase: 75 U/L (ref 39–117)
BILIRUBIN DIRECT: 0.1 mg/dL (ref 0.0–0.3)
TOTAL PROTEIN: 6.8 g/dL (ref 6.0–8.3)
Total Bilirubin: 0.4 mg/dL (ref 0.2–1.2)

## 2016-08-28 LAB — LIPID PANEL
CHOL/HDL RATIO: 4
Cholesterol: 171 mg/dL (ref 0–200)
HDL: 42.2 mg/dL (ref >39.00)
LDL Cholesterol: 115 mg/dL — ABNORMAL HIGH (ref 0–99)
NONHDL: 129.06
Triglycerides: 70 mg/dL (ref 0.0–149.0)
VLDL: 14 mg/dL (ref 0.0–40.0)

## 2016-08-28 LAB — BASIC METABOLIC PANEL
BUN: 20 mg/dL (ref 6–23)
CHLORIDE: 102 meq/L (ref 96–112)
CO2: 31 mEq/L (ref 19–32)
Calcium: 9.2 mg/dL (ref 8.4–10.5)
Creatinine, Ser: 1.17 mg/dL (ref 0.40–1.50)
GFR: 71.09 mL/min (ref >60.00)
Glucose, Bld: 106 mg/dL — ABNORMAL HIGH (ref 70–99)
POTASSIUM: 4.5 meq/L (ref 3.5–5.1)
SODIUM: 139 meq/L (ref 135–145)

## 2016-08-28 LAB — CBC WITH DIFFERENTIAL/PLATELET
Basophils Absolute: 0 10*3/uL (ref 0.0–0.1)
Basophils Relative: 0.5 % (ref 0.0–3.0)
Eosinophils Absolute: 0.2 10*3/uL (ref 0.0–0.7)
Eosinophils Relative: 2.8 % (ref 0.0–5.0)
HEMATOCRIT: 48.1 % (ref 39.0–52.0)
HEMOGLOBIN: 16.9 g/dL (ref 13.0–17.0)
LYMPHS PCT: 22.1 % (ref 12.0–46.0)
Lymphs Abs: 1.5 10*3/uL (ref 0.7–4.0)
MCHC: 35.2 g/dL (ref 30.0–36.0)
MCV: 90.4 fl (ref 78.0–100.0)
Monocytes Absolute: 0.6 10*3/uL (ref 0.1–1.0)
Monocytes Relative: 8.4 % (ref 3.0–12.0)
Neutro Abs: 4.5 10*3/uL (ref 1.4–7.7)
Neutrophils Relative %: 66.2 % (ref 43.0–77.0)
Platelets: 260 10*3/uL (ref 150.0–400.0)
RBC: 5.32 Mil/uL (ref 4.22–5.81)
RDW: 12.9 % (ref 11.5–15.5)
WBC: 6.8 10*3/uL (ref 4.0–10.5)

## 2016-08-28 NOTE — Progress Notes (Signed)
Pre visit review using our clinic review tool, if applicable. No additional management support is needed unless otherwise documented below in the visit note. 

## 2016-08-28 NOTE — Addendum Note (Signed)
Addended by: Damita LackLORING, DONNA S on: 08/28/2016 09:27 AM   Modules accepted: Orders

## 2016-08-28 NOTE — Progress Notes (Signed)
Dr. Karleen HampshireSpencer T. Dorsey Authement, MD, CAQ Sports Medicine Primary [facility] and Sports Medicine [address] [place], [id number] Phone: [phone] Fax: [phone]  08/28/2016  Patient: Vincent Winters, MRN: [id number], DOB: May 06, 1971, [age] y.o.  Primary Physician:  Hannah BeatSpencer Ozelle Brubacher, MD   Chief Complaint  Patient presents with  . Annual Exam   Subjective:   Vincent Winters is a 46 y.o. pleasant patient who presents with the following:  Preventative Health Maintenance Visit:  Health Maintenance Summary Reviewed and updated, unless pt declines services.  Tobacco History Reviewed. Alcohol: No concerns, no excessive use Exercise Habits: Some activity, rec at least 30 mins 5 times a week STD concerns: no risk or activity to increase risk Drug Use: None Encouraged self-testicular check  F/u l sided frozen shoulder, by my assessment, doing much better with dramatically improved ROM in all planes with the exception of IROM, although this is better as well. Still with dull ache. Not good compliance with HEP.   Health Maintenance  Topic Date Due  . HIV Screening  11/14/1984  . TETANUS/TDAP  06/10/2017 (Originally 11/14/1988)  . INFLUENZA VACCINE  Completed   Immunization History  Administered Date(s) Administered  . Influenza,inj,Quad PF,36+ Mos 06/10/2016  . Influenza-Unspecified 05/27/2015   Patient Active Problem List   Diagnosis Date Noted  . 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 Hypertension 09/07/2009  . Asthma 09/23/2008  . GERD 09/23/2008  . HIATAL HERNIA 09/23/2008   Past Medical History:  Diagnosis Date  . Asthma   . GERD (gastroesophageal reflux disease)   . Hiatal hernia   . Hypertension 09/07/2009   Qualifier: Diagnosis of  By: Patsy Lageropland MD, Karleen HampshireSpencer     Past Surgical History:  Procedure Laterality Date  . APPENDECTOMY     Social History   Social History  . Marital status: Single    Spouse name: N/A  . Number of children: N/A    . 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 Smoking status: Never Smoker  . Smokeless tobacco: Never Used  . Alcohol use Yes  . Drug use: No  . Sexual activity: Not on file   Other Topics Concern  . Not on file   Social History Narrative   Regular exercise-yes      [place]   Family History  Problem Relation Age of Onset  . Hypertension Father    No Known Allergies  Medication list has been reviewed and updated.   General: Denies fever, chills, sweats. No significant weight loss. Eyes: Denies blurring,significant itching ENT: Denies earache, sore throat, and hoarseness. Cardiovascular: Denies chest pains, palpitations, dyspnea on exertion Respiratory: Denies cough, dyspnea at rest,wheeezing Breast: no concerns about lumps GI: Denies nausea, vomiting, diarrhea, constipation, change in bowel habits, abdominal pain, melena, hematochezia GU: Denies penile discharge, ED, urinary flow / outflow problems. No STD concerns. Musculoskeletal: Shoulder pain as above Derm: Denies rash, itching Neuro: Denies  paresthesias, frequent falls, frequent headaches Psych: Denies depression, anxiety Endocrine: Denies cold intolerance, heat intolerance, polydipsia Heme: Denies enlarged lymph nodes Allergy: No hayfever  Objective:   BP 131/83   Pulse 69   Temp 98.4 F (36.9 C) (Oral)   Ht 5' 7.25" (1.708 m)   Wt 185 lb 4 oz (84 kg)   BMI 28.80 kg/m  Ideal Body Weight: Weight in (lb) to have BMI = 25: 160.5  No exam data present  GEN: well developed, well nourished, no acute distress Eyes: conjunctiva and lids normal, PERRLA, EOMI ENT: TM clear, nares clear, oral exam WNL Neck: supple, no lymphadenopathy, no thyromegaly, no JVD Pulm: clear to auscultation and percussion, respiratory effort normal CV: regular rate and rhythm, S1-S2, no murmur, rub or gallop, no bruits, peripheral pulses normal and symmetric, no  cyanosis, clubbing, edema or varicosities GI: soft, non-tender; no hepatosplenomegaly, masses; active bowel sounds all quadrants GU: no hernia, testicular mass, penile discharge Lymph: no cervical, axillary or inguinal adenopathy MSK:   Shoulder: R and L Inspection: No muscle wasting or winging Ecchymosis/edema: neg  AC joint, scapula, clavicle: NT Cervical spine: NT, full ROM Spurling's: neg ABNORMAL SIDE TESTED: L UNLESS OTHERWISE NOTED, THE CONTRALATERAL SIDE HAS FULL RANGE OF MOTION. Abduction: 5/5, LIMITED TO 170 DEGREES Flexion: 5/5, LIMITED TO 170 DEGNO ROM  IR, lift-off: 5/5. TESTED AT 90 DEGREES OF ABDUCTION, LIMITED TO 25 DEGREES ER at neutral:  5/5, TESTED AT 90 DEGREES OF ABDUCTION, LIMITED TO 80 DEGREES AC crossover and compression: PAIN Drop Test: neg Empty Can: neg Supraspinatus insertion: NT Bicipital groove: NT C5-T1 intact Sensation intact Grip 5/5    SKIN: clear, good turgor, color WNL, no rashes, lesions, or ulcerations Neuro: normal mental status, normal strength, sensation, and motion Psych: alert; oriented to person, place and time, normally interactive and not anxious or depressed in appearance.  All labs reviewed with patient.   Assessment and Plan:   Healthcare maintenance  Screening, lipid - Plan: Lipid panel  Other fatigue - Plan: Basic metabolic panel, CBC with Differential/Platelet, Hepatic function panel  Health Maintenance Exam: The patient's preventative maintenance and recommended screening tests for an annual wellness exam were reviewed in full today. Brought up to date unless services declined.  Counselled on the importance of diet, exercise, and its role in overall health and mortality. The patient's FH and SH was reviewed, including their home life, tobacco status, and drug and alcohol status.  Follow-up in 1 year for physical exam or additional follow-up below.  Follow-up: No Follow-up on file. Or follow-up in 1 year if not  noted.  There are no discontinued medications. Orders Placed This Encounter  Procedures  . Lipid panel  . Basic metabolic panel  . CBC with Differential/Platelet  . Hepatic function panel    Signed,  Karleen Hampshire T. Lauralie Blacksher, MD   Allergies as of 08/28/2016   No Known Allergies     Medication List       Accurate as of 08/28/16  9:08 AM. Always use your most recent med list.          albuterol 108 (90 Base) MCG/ACT inhaler Commonly known as:  PROVENTIL HFA;VENTOLIN HFA Inhale 2 puffs into the lungs every 6 (six) hours as needed.   amLODipine 10 MG tablet Commonly known as:  NORVASC Take 1 tablet (10 mg total) by mouth daily.   ibuprofen 200 MG tablet Commonly known as:  ADVIL,MOTRIN Take 400 mg by mouth every 6 (six) hours as needed.   naproxen 500 MG tablet Commonly known as:  NAPROSYN Take 1 tablet (500 mg total) by mouth 2 (two) times daily as needed.   omeprazole 20 MG capsule Commonly known as:  PRILOSEC Take 1 capsule (20 mg total) by mouth 2 (two) times daily before a meal.   triamcinolone cream 0.1 % Commonly known as:  KENALOG Apply 1 application topically 2 (two) times daily.

## 2016-08-29 ENCOUNTER — Encounter: Payer: Self-pay | Admitting: *Deleted

## 2016-09-13 ENCOUNTER — Ambulatory Visit (INDEPENDENT_AMBULATORY_CARE_PROVIDER_SITE_OTHER): Payer: Managed Care, Other (non HMO) | Admitting: Family Medicine

## 2016-09-13 ENCOUNTER — Encounter: Payer: Self-pay | Admitting: Family Medicine

## 2016-09-13 DIAGNOSIS — I10 Essential (primary) hypertension: Secondary | ICD-10-CM

## 2016-09-13 NOTE — Patient Instructions (Signed)
Cut amlodipine in half for now.  You may need a full dose later on.  Check BP when sitting, at rest after urinating.  Drink enough fluid to keep your urine clear.  Update us as needed.

## 2016-09-13 NOTE — Progress Notes (Signed)
Sx in the last few days.  Lightheaded on standing.  No antecedent sx initially.  When he stood up initially, he would get lightheaded.  Sx worse laying down.  Sx less severe today. Not having true vertigo sx.  No CP, SOB, no BLE edema.  No sx rolling over in the bed.  No syncope.  UOP fair, not drinking a lot of fluids today.  No fevers.  No cold sweats.    Hasn't checked BP at home recently.    Meds, vitals, and allergies reviewed.   ROS: Per HPI unless specifically indicated in ROS section   GEN: nad, alert and oriented HEENT: mucous membranes moist, tm wnl, OP wnl NECK: supple w/o LA CV: rrr.  PULM: ctab, no inc wob ABD: soft, +bs EXT: no edema CN 2-12 wnl B, S/S wnl x4, cerebellar testing wnl B  Orthostatic BPs noted, with inc in pulse.

## 2016-09-13 NOTE — Assessment & Plan Note (Addendum)
Now with orthostatic sx.  Not vertigo.  D/w pt.  Likely overtreated HTN.  Cut amlodipine to 5mg  a day, inc fluid intake.  D/w pt.  If persistently lightheaded or if BP concerns, then update us.  He agrees.   This may be a transient issue and with more fluid intake his sx may resolve and he may end up needing to go back on higher dose of CCB in the future.   Routed to PCP as FYI.  Okay for outpatient f/u.

## 2017-06-15 ENCOUNTER — Other Ambulatory Visit: Payer: Self-pay | Admitting: Family Medicine

## 2017-06-16 NOTE — Telephone Encounter (Signed)
Controlled substance (Xanax)

## 2017-06-16 NOTE — Telephone Encounter (Signed)
The patient does not take xanax. He is asking for a refill of amlodipine. This norvasc.   Generic xanax is alprazolam --- this is a very important distinction.  I will handle this.

## 2017-06-16 NOTE — Telephone Encounter (Signed)
:  Last office visit 08/2016.   Pt has had 3 or more refills since last office visit.

## 2017-09-29 ENCOUNTER — Ambulatory Visit: Payer: Managed Care, Other (non HMO) | Admitting: Family Medicine

## 2017-09-29 ENCOUNTER — Other Ambulatory Visit: Payer: Self-pay

## 2017-09-29 ENCOUNTER — Encounter: Payer: Self-pay | Admitting: Family Medicine

## 2017-09-29 VITALS — BP 130/100 | HR 84 | Temp 99.1°F | Ht 67.25 in | Wt 194.2 lb

## 2017-09-29 DIAGNOSIS — R079 Chest pain, unspecified: Secondary | ICD-10-CM | POA: Diagnosis not present

## 2017-09-29 DIAGNOSIS — J4520 Mild intermittent asthma, uncomplicated: Secondary | ICD-10-CM | POA: Diagnosis not present

## 2017-09-29 DIAGNOSIS — R55 Syncope and collapse: Secondary | ICD-10-CM

## 2017-09-29 MED ORDER — AMLODIPINE BESYLATE 5 MG PO TABS: 5.0000 mg | ORAL_TABLET | Freq: Every day | ORAL | 3 refills | Status: DC

## 2017-09-29 MED ORDER — FLUTICASONE PROPIONATE HFA 110 MCG/ACT IN AERO: 1.0000 | INHALATION_SPRAY | Freq: Two times a day (BID) | RESPIRATORY_TRACT | 3 refills | Status: DC

## 2017-09-29 MED ORDER — ALBUTEROL SULFATE HFA 108 (90 BASE) MCG/ACT IN AERS: 2.0000 | INHALATION_SPRAY | Freq: Four times a day (QID) | RESPIRATORY_TRACT | 1 refills | Status: DC | PRN

## 2017-09-29 NOTE — Patient Instructions (Signed)

## 2017-09-29 NOTE — Progress Notes (Signed)
Dr. Karleen HampshireSpencer T. Jeanluc Wegman, MD, CAQ Sports Medicine Primary Care and Sports Medicine [address] [place], [id number] Phone: [phone] Fax: [phone]  09/29/2017  Patient: Vincent Winters, MRN: [id number], DOB: 09-13-1970, 47 y.o.  Primary Physician:  Hannah Beatopland, Arron Tetrault, MD   Chief Complaint  Patient presents with  . Follow-up    ED VISIT-IRVING TX-CP/SYNCOPE   Subjective:   Vincent ChampagneDaniel James Winters is a 47 y.o. very pleasant male patient who presents with the following:  F/u ER, syncope. Total.  Very pleasant gentleman, who was on a business trip in [place], and while he got out of bed he sustained some frank syncope and completely collapsed in the middle of his bedroom.  He was also having some chest pain on the left side, which is been a ongoing issue for him.  He is seeing cardiology in the past.  He went to the ER promptly for evaluation, they did serial cardiac enzymes as well as 2 EKGs which were normal.  They also did some other basic lab work.  I do not have all of these records for review currently, the we requested them last Friday.  I will review all of them in their entirety when they are available.  Currently he feels reasonably okay, he has had some at least subjective hypotensive feelings off and on for some time.  He worries about his blood pressure.  1 of my partner suggested that he decrease his amlodipine dose earlier in the year, and he has done this sometimes.  L sided chest pain.  Has seen Dr. Mariah MillingGollan before.  Pain felt different in his chest, too.  Past Medical History, Surgical History, Social History, Family History, Problem List, Medications, and Allergies have been reviewed and updated if relevant.  Patient Active Problem List   Diagnosis Date Noted  . 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 Hypertension 09/07/2009  . Asthma 09/23/2008  . GERD 09/23/2008  . HIATAL HERNIA 09/23/2008    Past Medical History:  Diagnosis Date  .  Asthma   . GERD (gastroesophageal reflux disease)   . Hiatal hernia   . Hypertension 09/07/2009   Qualifier: Diagnosis of  By: Patsy Lageropland MD, Karleen HampshireSpencer      Past Surgical History:  Procedure Laterality Date  . APPENDECTOMY      Social History   Socioeconomic History  . Marital status: Single    Spouse name: Not on file  . Number of children: Not on file  . Years of education: Not on file  . Highest education level: Not on file  Social Needs  . Financial resource strain: Not on file  . Food insecurity - worry: Not on file  . Food insecurity - inability: Not on file  . Transportation needs - medical: Not on file  . Transportation needs - non-medical: Not on file  Occupational History  . Occupation: MFG Garment/textile technologistengineer    Employer: [employer]  Tobacco Use  . Smoking status: Never Smoker  . Smokeless tobacco: Never Used  Substance and Sexual Activity  . Alcohol use: Yes  . Drug use: No  . Sexual activity: Not on file  Other Topics Concern  . Not on file  Social History Narrative   Regular exercise-yes      [place]    Family History  Problem Relation Age of Onset  . Hypertension Father     No Known Allergies  Medication list reviewed and updated in full in [facility].  GEN: No acute illnesses, no fevers, chills. GI: No n/v/d, eating normally Pulm: No SOB Interactive and getting along well at home.  Otherwise, ROS is as per the HPI.  Objective:   BP (!) 130/100   Pulse 84   Temp 99.1 F (37.3 C) (Oral)   Ht 5' 7.25" (1.708 m)   Wt 194 lb 4 oz (88.1 kg)   SpO2 97%   BMI 30.20 kg/m   GEN: WDWN, NAD, Non-toxic, A & O x 3 HEENT: Atraumatic, Normocephalic. Neck supple. No masses, No LAD. Ears and Nose: No external deformity. CV: RRR, No M/G/R. No JVD. No thrill. No extra heart sounds. PULM: CTA B, no wheezes, crackles, rhonchi. No retractions. No resp. distress. No accessory muscle use. EXTR: No c/c/e NEURO Normal gait.  PSYCH: Normally interactive.  Conversant. Not depressed or anxious appearing.  Calm demeanor.   Laboratory and Imaging Data:  Await [place] records  Assessment and Plan:   Syncope and collapse - Plan: Ambulatory referral to Cardiology  Left sided chest pain - Plan: Ambulatory referral to Cardiology  Mild intermittent asthma without complication   Syncope and collapse, status post ER evaluation with normal cardiac enzymes and normal EKG.  Intermittent and recurring subjective hypotension and lightheadedness.  I am going to decrease his amlodipine to 5 mg a day to see if this helps.  Refill his asthma medication.  In the above and syncope without clear diagnosis, I am going to have him see cardiology for further evaluation and appreciate their help.  Follow-up: No Follow-up on file.  Meds ordered this encounter  Medications  . fluticasone (FLOVENT HFA) 110 MCG/ACT inhaler    Sig: Inhale 1 puff into the lungs 2 (two) times daily.    Dispense:  3 Inhaler    Refill:  3  . albuterol (PROVENTIL HFA;VENTOLIN HFA) 108 (90 Base) MCG/ACT inhaler    Sig: Inhale 2 puffs into the lungs every 6 (six) hours as needed.    Dispense:  3 Inhaler    Refill:  1  . amLODipine (NORVASC) 5 MG tablet    Sig: Take 1 tablet (5 mg total) by mouth daily.    Dispense:  90 tablet    Refill:  3   Medications Discontinued During This Encounter  Medication Reason  . albuterol (PROVENTIL HFA;VENTOLIN HFA) 108 (90 BASE) MCG/ACT inhaler Reorder  . amLODipine (NORVASC) 10 MG tablet    Orders Placed This Encounter  Procedures  . Ambulatory referral to Cardiology    Signed,  Karleen Hampshire T. Zuma Hust, MD   Allergies as of 09/29/2017   No Known Allergies     Medication List        Accurate as of 09/29/17 11:59 PM. Always use your most recent med list.          albuterol 108 (90 Base) MCG/ACT inhaler Commonly known as:  PROVENTIL HFA;VENTOLIN HFA Inhale 2 puffs into the lungs every 6 (six) hours as needed.   amLODipine 5 MG  tablet Commonly known as:  NORVASC Take 1 tablet (5 mg total) by mouth daily.   fluticasone 110 MCG/ACT inhaler Commonly known as:  FLOVENT HFA Inhale 1 puff into the lungs 2 (two) times daily.   ibuprofen 200 MG tablet Commonly known as:  ADVIL,MOTRIN Take 400 mg by mouth every 6 (six) hours as needed.   naproxen 500 MG tablet Commonly known as:  NAPROSYN Take 1 tablet (500 mg total) by mouth 2 (two) times daily as needed.   omeprazole 20  MG capsule Commonly known as:  PRILOSEC Take 1 capsule (20 mg total) by mouth 2 (two) times daily before a meal.   triamcinolone cream 0.1 % Commonly known as:  KENALOG Apply 1 application topically 2 (two) times daily.

## 2017-09-30 ENCOUNTER — Ambulatory Visit: Payer: Managed Care, Other (non HMO) | Admitting: Internal Medicine

## 2017-09-30 ENCOUNTER — Encounter: Payer: Self-pay | Admitting: Internal Medicine

## 2017-09-30 VITALS — BP 140/82 | HR 84 | Ht 68.0 in | Wt 193.2 lb

## 2017-09-30 DIAGNOSIS — R55 Syncope and collapse: Secondary | ICD-10-CM

## 2017-09-30 NOTE — Patient Instructions (Addendum)
Medication Instructions:  Your physician recommends that you continue on your current medications as directed. Please refer to the Current Medication list given to you today.  Labwork: None ordered.  Testing/Procedures: Your physician has requested that you have an echocardiogram. Echocardiography is a painless test that uses sound waves to create images of your heart. It provides your doctor with information about the size and shape of your heart and how well your heart's chambers and valves are working. This procedure takes approximately one hour. There are no restrictions for this procedure.   Follow-Up: Your physician recommends that you schedule a follow-up appointment in:   6 months with Dr. Klein   Any Other Special Instructions Will Be Listed Below (If Applicable).     If you need a refill on your cardiac medications before your next appointment, please call your pharmacy.  

## 2017-09-30 NOTE — Progress Notes (Signed)
ELECTROPHYSIOLOGY CONSULT NOTE  Patient ID: Vincent Winters, MRN: [id number], DOB/AGE: September 20, 1970 47 y.o. Admit date: (Not on file) Date of Consult: 09/30/2017  Primary Physician: Hannah Beatopland, Spencer, MD Primary Cardiologist: new     Vincent Winters is a 47 y.o. male who is being seen today for the evaluation of syncope at the request of [place].    HPI Vincent Winters is a 47 y.o. 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  Most recently he was in [place].  He has had recurrent sharp fleeting chest pains in his left chest.  These would last seconds and be aggravated by movement.  He had one beer that night.  He went to bed and awakened with significant left-sided chest pain similar to the earlier pains of longer duration.  He got up from bed walked into the other room in his hotel and passed out.  Upon awakening he had residual orthostatic intolerance but denies flushing diaphoresis or nausea.  He took himself to the emergency room where the evaluation was apparently negative.  Serial troponins, ECGs and imaging studies were done.  No prior syncope has been triggered by severe pain.  Describes 2 episodes regularly.  There was some awareness that syncope was unfolding.  He also had convulsions apparently with these episodes.  Were again associated with residual orthostatic intolerance but no other epiphenomena.  He has had multiple episodes of presyncope again triggered by pain which he has been able to abort by sitting or lying down  He is not fit and does have chronic dyspnea on exertion.  This is unchanged.  He does not have edema.  He has no exercise associated chest pain.  He was seen a couple years by Dr. Knute NeuG for specified chest pain and underwent Myoview scanning which was negative.  Cardiac risk factors are negative for his family history.  He has hypertension.  Moderately elevated LDL 115 on 1/18     Past Medical  History:  Diagnosis Date  . Asthma   . GERD (gastroesophageal reflux disease)   . Hiatal hernia   . Hypertension 09/07/2009   Qualifier: Diagnosis of  By: Patsy Lageropland MD, Karleen HampshireSpencer        Surgical History:  Past Surgical History:  Procedure Laterality Date  . APPENDECTOMY       Home Meds: Prior to Admission medications   Medication Sig Start Date End Date Taking? Authorizing Provider  albuterol (PROVENTIL HFA;VENTOLIN HFA) 108 (90 Base) MCG/ACT inhaler Inhale 2 puffs into the lungs every 6 (six) hours as needed. 09/29/17  Yes Copland, Karleen HampshireSpencer, MD  amLODipine (NORVASC) 5 MG tablet Take 1 tablet (5 mg total) by mouth daily. 09/29/17  Yes Copland, Karleen HampshireSpencer, MD  fluticasone (FLOVENT HFA) 110 MCG/ACT inhaler Inhale 1 puff into the lungs 2 (two) times daily. 09/29/17  Yes Copland, Karleen HampshireSpencer, MD  ibuprofen (ADVIL,MOTRIN) 200 MG tablet Take 400 mg by mouth every 6 (six) hours as needed.     Yes [provider]  naproxen (NAPROSYN) 500 MG tablet Take 1 tablet (500 mg total) by mouth 2 (two) times daily as needed. 10/19/14  Yes Antonieta IbaGollan, Timothy J, MD  omeprazole (PRILOSEC) 20 MG capsule Take 1 capsule (20 mg total) by mouth 2 (two) times daily before a meal. Patient taking differently: Take 20 mg by mouth daily.  10/19/14  Yes Antonieta IbaGollan, Timothy J, MD  triamcinolone cream (KENALOG) 0.1 % Apply 1 application topically 2 (two) times daily. 10/17/14  Yes Copland, Karleen Hampshire, MD    Allergies: No Known Allergies  Social History   Socioeconomic History  . Marital status: Single    Spouse name: Not on file  . Number of children: Not on file  . Years of education: Not on file  . Highest education level: Not on file  Social Needs  . Financial resource strain: Not on file  . Food insecurity - worry: Not on file  . Food insecurity - inability: Not on file  . Transportation needs - medical: Not on file  . Transportation needs - non-medical: Not on file  Occupational History  . Occupation: MFG Multimedia programmer: [employer]  Tobacco Use  . Smoking status: Never Smoker  . Smokeless tobacco: Never Used  Substance and Sexual Activity  . Alcohol use: Yes  . Drug use: No  . Sexual activity: Not on file  Other Topics Concern  . Not on file  Social History Narrative   Regular exercise-yes      From [place]     Family History  Problem Relation Age of Onset  . Hypertension Father      ROS:  Please see the history of present illness.     All other systems reviewed and negative.    Physical Exam:  Blood pressure 140/82, pulse 84, height 5\' 8"  (1.727 m), weight 193 lb 4 oz (87.7 kg), SpO2 98 %. General: Well developed, well nourished male in no acute distress. Head: Normocephalic, atraumatic, sclera non-icteric, no xanthomas, nares are without discharge. EENT: normal  Lymph Nodes:  none Neck: Negative for carotid bruits. JVD not elevated. Back:without scoliosis kyphosis Lungs: Clear bilaterally to auscultation without wheezes, rales, or rhonchi. Breathing is unlabored. Heart: RRR with S1 S2. No  murmur . No rubs, or gallops appreciated. Abdomen: Soft, non-tender, non-distended with normoactive bowel sounds. No hepatomegaly. No rebound/guarding. No obvious abdominal masses. Msk:  Strength and tone appear normal for age. Extremities: No clubbing or cyanosis. No edema.  Distal pedal pulses are 2+ and equal bilaterally. Skin: Warm and Dry Neuro: Alert and oriented X 3. CN III-XII intact Grossly normal sensory and motor function . Psych:  Responds to questions appropriately with a normal affect.      Labs: Cardiac Enzymes No results for input(s): CKTOTAL, CKMB, TROPONINI in the last 72 hours. CBC Lab Results  Component Value Date   WBC 6.8 08/28/2016   HGB 16.9 08/28/2016   HCT 48.1 08/28/2016   MCV 90.4 08/28/2016   PLT 260.0 08/28/2016   PROTIME: No results for input(s): LABPROT, INR in the last 72 hours. Chemistry No results for input(s): NA, K, CL, CO2, BUN, CREATININE,  CALCIUM, PROT, BILITOT, ALKPHOS, ALT, AST, GLUCOSE in the last 168 hours.  Invalid input(s): LABALBU Lipids Lab Results  Component Value Date   CHOL 171 08/28/2016   HDL 42.20 08/28/2016   LDLCALC 115 (H) 08/28/2016   TRIG 70.0 08/28/2016   BNP No results found for: PROBNP Thyroid Function Tests: No results for input(s): TSH, T4TOTAL, T3FREE, THYROIDAB in the last 72 hours.  Invalid input(s): FREET3 Miscellaneous No results found for: DDIMER  Radiology/Studies:  No results found.  EKG: sinus @84  16/08/34   Assessment and Plan:  Syncope  Hypertension  Palpitations  The patient has had recurrent episodes of syncope and presyncope associated with pain triggers.  The episode in question was also associated with pain albeit not quite as severe as other triggering events.  Presence of residual orthostatic intolerance  makes vasomotor instability a more likely scenario.  His ECG is normal.  We will undertake an echocardiogram.  His heart is structurally normal this makes the likelihood of life-threatening arrhythmias very small.  He could have SVT as a trigger as he has had palpitations in the past although they have never been symptomatic.  We have discussed vasomotor instability and triggering of neurally mediated syncope and the importance of recognizing the trigger and becoming supine.  He is advised of [place] driving restrictions 3-6 months for uncontrolled syncope depending on whether or not he has structural heart disease  Sherryl Manges

## 2017-10-13 ENCOUNTER — Ambulatory Visit (INDEPENDENT_AMBULATORY_CARE_PROVIDER_SITE_OTHER): Payer: Managed Care, Other (non HMO)

## 2017-10-13 ENCOUNTER — Other Ambulatory Visit: Payer: Self-pay

## 2017-10-13 DIAGNOSIS — R55 Syncope and collapse: Secondary | ICD-10-CM

## 2017-10-14 ENCOUNTER — Encounter: Payer: Self-pay | Admitting: Internal Medicine

## 2017-10-14 ENCOUNTER — Telehealth: Payer: Self-pay

## 2017-10-14 NOTE — Telephone Encounter (Signed)
-----   Message from Duke SalviaSteven C Klein, MD sent at 10/13/2017  5:37 PM EST ----- Please Inform Patient Vincent Winters showed  normal heart muscle function   this is great news

## 2017-10-14 NOTE — Telephone Encounter (Signed)
LM on patients cell phone with normal ECHO results.

## 2018-09-22 NOTE — Progress Notes (Deleted)
Dr. Karleen Hampshire T. Moselle Rister, MD, CAQ Sports Medicine Primary Care and Sports Medicine [address] [place], [address] Phone: [phone] Fax: [phone]  09/24/2018  Patient: Vincent Winters, MRN: [id number], DOB: 03/02/1971, 48 y.o.  Primary Physician:  Hannah Beat, MD   No chief complaint on file.  Subjective:   Vincent Winters is a 48 y.o. very pleasant male patient who presents with the following:  Vincent Winters presents with concerns that he may have hemorrhoids.   Past Medical History, Surgical History, Social History, Family History, Problem List, Medications, and Allergies have been reviewed and updated if relevant.  Patient Active Problem List   Diagnosis Date Noted  . 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 Hypertension 09/07/2009  . Asthma 09/23/2008  . GERD 09/23/2008  . HIATAL HERNIA 09/23/2008    Past Medical History:  Diagnosis Date  . Asthma   . GERD (gastroesophageal reflux disease)   . Hiatal hernia   . Hypertension 09/07/2009   Qualifier: Diagnosis of  By: Patsy Lager MD, Karleen Hampshire      Past Surgical History:  Procedure Laterality Date  . APPENDECTOMY      Social History   Socioeconomic History  . Marital status: Single    Spouse name: Not on file  . Number of children: Not on file  . Years of education: Not on file  . Highest education level: Not on file  Occupational History  . Occupation: MFG Garment/textile technologist: [employer]  Social Needs  . Financial resource strain: Not on file  . Food insecurity:    Worry: Not on file    Inability: Not on file  . Transportation needs:    Medical: Not on file    Non-medical: Not on file  Tobacco Use  . Smoking status: Never Smoker  . Smokeless tobacco: Never Used  Substance and Sexual Activity  . Alcohol use: Yes  . Drug use: No  . Sexual activity: Not on file  Lifestyle  . Physical activity:    Days per week: Not on file    Minutes per session: Not on file  . Stress:  Not on file  Relationships  . Social connections:    Talks on phone: Not on file    Gets together: Not on file    Attends religious service: Not on file    Active member of club or organization: Not on file    Attends meetings of clubs or organizations: Not on file    Relationship status: Not on file  . Intimate partner violence:    Fear of current or ex partner: Not on file    Emotionally abused: Not on file    Physically abused: Not on file    Forced sexual activity: Not on file  Other Topics Concern  . Not on file  Social History Narrative   Regular exercise-yes      From [place]    Family History  Problem Relation Age of Onset  . Hypertension Father     No Known Allergies  Medication list reviewed and updated in full in [facility].   GEN: No acute illnesses, no fevers, chills. GI: No n/v/d, eating normally Pulm: No SOB Interactive and getting along well at home.  Otherwise, ROS is as per the HPI.  Objective:   There were no vitals taken for this visit.  GEN: WDWN, NAD, Non-toxic, A & O x 3 HEENT: Atraumatic, Normocephalic. Neck supple. No masses, No LAD.  Ears and Nose: No external deformity. CV: RRR, No M/G/R. No JVD. No thrill. No extra heart sounds. PULM: CTA B, no wheezes, crackles, rhonchi. No retractions. No resp. distress. No accessory muscle use. EXTR: No c/c/e NEURO Normal gait.  PSYCH: Normally interactive. Conversant. Not depressed or anxious appearing.  Calm demeanor.   Laboratory and Imaging Data:  Assessment and Plan:   ***

## 2018-09-24 ENCOUNTER — Ambulatory Visit: Payer: Managed Care, Other (non HMO) | Admitting: Family Medicine

## 2018-09-30 ENCOUNTER — Other Ambulatory Visit: Payer: Self-pay | Admitting: Family Medicine

## 2018-10-05 ENCOUNTER — Encounter: Payer: Self-pay | Admitting: Family Medicine

## 2018-10-05 ENCOUNTER — Ambulatory Visit: Payer: BLUE CROSS/BLUE SHIELD | Admitting: Family Medicine

## 2018-10-05 VITALS — BP 122/80 | HR 94 | Temp 98.6°F | Ht 67.0 in | Wt 192.8 lb

## 2018-10-05 DIAGNOSIS — J209 Acute bronchitis, unspecified: Secondary | ICD-10-CM

## 2018-10-05 DIAGNOSIS — J4521 Mild intermittent asthma with (acute) exacerbation: Secondary | ICD-10-CM

## 2018-10-05 MED ORDER — AZITHROMYCIN 250 MG PO TABS: ORAL_TABLET | ORAL | 0 refills | Status: AC

## 2018-10-05 MED ORDER — HYDROCODONE-HOMATROPINE 5-1.5 MG/5ML PO SYRP: ORAL_SOLUTION | ORAL | 0 refills | Status: DC

## 2018-10-05 NOTE — Progress Notes (Signed)
Dr. Karleen Hampshire T. Arlin Sass, MD, CAQ Sports Medicine Primary Care and Sports Medicine [address] [place], [address] Phone: [phone] Fax: [phone]  10/05/2018  Patient: Vincent Winters, MRN: [id number], DOB: 06-26-1971, 48 y.o.  Primary Physician:  Hannah Beat, MD   Chief Complaint  Patient presents with  . Cough   Subjective:   Vincent Winters is a 48 y.o. very pleasant male patient who presents with the following:  Had a cough last week, had a high fever until Saturday - Thursday broke out in a cold.  No polyarthralgia.  Sore throat at the time.  Cough. Able to eat. Decreased appetite.  Some R earache.  Throat is a little bit better.  Cough is the worst thing now.   Has had some wheezing  Past Medical History, Surgical History, Social History, Family History, Problem List, Medications, and Allergies have been reviewed and updated if relevant.  Patient Active Problem List   Diagnosis Date Noted  . 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 Hypertension 09/07/2009  . Asthma 09/23/2008  . GERD 09/23/2008  . HIATAL HERNIA 09/23/2008    Past Medical History:  Diagnosis Date  . Asthma   . GERD (gastroesophageal reflux disease)   . Hiatal hernia   . Hypertension 09/07/2009   Qualifier: Diagnosis of  By: Patsy Lager MD, Karleen Hampshire      Past Surgical History:  Procedure Laterality Date  . APPENDECTOMY      Social History   Socioeconomic History  . Marital status: Single    Spouse name: Not on file  . Number of children: Not on file  . Years of education: Not on file  . Highest education level: Not on file  Occupational History  . Occupation: MFG Garment/textile technologist: [employer]  Social Needs  . Financial resource strain: Not on file  . Food insecurity:    Worry: Not on file    Inability: Not on file  . Transportation needs:    Medical: Not on file    Non-medical: Not on file  Tobacco Use  . Smoking status: Never Smoker  .  Smokeless tobacco: Never Used  Substance and Sexual Activity  . Alcohol use: Yes  . Drug use: No  . Sexual activity: Not on file  Lifestyle  . Physical activity:    Days per week: Not on file    Minutes per session: Not on file  . Stress: Not on file  Relationships  . Social connections:    Talks on phone: Not on file    Gets together: Not on file    Attends religious service: Not on file    Active member of club or organization: Not on file    Attends meetings of clubs or organizations: Not on file    Relationship status: Not on file  . Intimate partner violence:    Fear of current or ex partner: Not on file    Emotionally abused: Not on file    Physically abused: Not on file    Forced sexual activity: Not on file  Other Topics Concern  . Not on file  Social History Narrative   Regular exercise-yes      From [place] state    Family History  Problem Relation Age of Onset  . Hypertension Father     No Known Allergies  Medication list reviewed and updated in full in [facility].  ROS: GEN: Acute illness details above GI: Tolerating PO  intake GU: maintaining adequate hydration and urination Pulm: No SOB Interactive and getting along well at home.  Otherwise, ROS is as per the HPI.  Objective:   BP 122/80   Pulse 94   Temp 98.6 F (37 C) (Oral)   Ht 5\' 7"  (1.702 m)   Wt 192 lb 12 oz (87.4 kg)   SpO2 98%   BMI 30.19 kg/m    GEN: A and O x 3. WDWN. NAD.    ENT: Nose clear, ext NML.  No LAD.  No JVD.  TM's clear. Oropharynx clear.  PULM: Normal WOB, no distress. No crackles, wheezes, + B bases with rhonchi. CV: RRR, no M/G/R, No rubs, No JVD.   EXT: warm and well-perfused, No c/c/e. PSYCH: Pleasant and conversant.    Laboratory and Imaging Data:  Assessment and Plan:   Acute bronchitis, unspecified organism  Mild intermittent asthma with exacerbation  Cont prn albuterol, add abx  Follow-up: No follow-ups on file.  Meds ordered this encounter    Medications  . HYDROcodone-homatropine (HYCODAN) 5-1.5 MG/5ML syrup    Sig: 1 tsp po at night before bed prn cough    Dispense:  180 mL    Refill:  0  . azithromycin (ZITHROMAX) 250 MG tablet    Sig: Take 2 tablets (500 mg total) by mouth daily for 1 day, THEN 1 tablet (250 mg total) daily for 4 days.    Dispense:  6 tablet    Refill:  0    Signed,  Lempi Edwin T. Jerlisa Diliberto, MD   Outpatient Encounter Medications as of 10/05/2018  Medication Sig  . albuterol (PROVENTIL HFA;VENTOLIN HFA) 108 (90 Base) MCG/ACT inhaler Inhale 2 puffs into the lungs every 6 (six) hours as needed.  Marland Kitchen amLODipine (NORVASC) 5 MG tablet TAKE 1 TABLET DAILY  . fluticasone (FLOVENT HFA) 110 MCG/ACT inhaler Inhale 1 puff into the lungs 2 (two) times daily.  Marland Kitchen ibuprofen (ADVIL,MOTRIN) 200 MG tablet Take 400 mg by mouth every 6 (six) hours as needed.    Marland Kitchen omeprazole (PRILOSEC) 20 MG capsule Take 20 mg by mouth daily.  Marland Kitchen triamcinolone cream (KENALOG) 0.1 % Apply 1 application topically 2 (two) times daily.  Marland Kitchen azithromycin (ZITHROMAX) 250 MG tablet Take 2 tablets (500 mg total) by mouth daily for 1 day, THEN 1 tablet (250 mg total) daily for 4 days.  Marland Kitchen HYDROcodone-homatropine (HYCODAN) 5-1.5 MG/5ML syrup 1 tsp po at night before bed prn cough  . [DISCONTINUED] naproxen (NAPROSYN) 500 MG tablet Take 1 tablet (500 mg total) by mouth 2 (two) times daily as needed.  . [DISCONTINUED] omeprazole (PRILOSEC) 20 MG capsule Take 1 capsule (20 mg total) by mouth 2 (two) times daily before a meal. (Patient taking differently: Take 20 mg by mouth daily. )   No facility-administered encounter medications on file as of 10/05/2018.

## 2018-10-08 ENCOUNTER — Other Ambulatory Visit: Payer: Self-pay | Admitting: Family Medicine

## 2018-10-08 DIAGNOSIS — Z0000 Encounter for general adult medical examination without abnormal findings: Secondary | ICD-10-CM

## 2018-10-08 DIAGNOSIS — Z131 Encounter for screening for diabetes mellitus: Secondary | ICD-10-CM

## 2018-10-12 ENCOUNTER — Other Ambulatory Visit (INDEPENDENT_AMBULATORY_CARE_PROVIDER_SITE_OTHER): Payer: BLUE CROSS/BLUE SHIELD

## 2018-10-12 DIAGNOSIS — Z131 Encounter for screening for diabetes mellitus: Secondary | ICD-10-CM | POA: Diagnosis not present

## 2018-10-12 DIAGNOSIS — Z0000 Encounter for general adult medical examination without abnormal findings: Secondary | ICD-10-CM

## 2018-10-13 LAB — CBC WITH DIFFERENTIAL/PLATELET
Basophils Absolute: 0.1 10*3/uL (ref 0.0–0.1)
Basophils Relative: 1.1 % (ref 0.0–3.0)
Eosinophils Absolute: 0.4 10*3/uL (ref 0.0–0.7)
Eosinophils Relative: 4.3 % (ref 0.0–5.0)
HEMATOCRIT: 47.6 % (ref 39.0–52.0)
Hemoglobin: 16.5 g/dL (ref 13.0–17.0)
LYMPHS PCT: 23.1 % (ref 12.0–46.0)
Lymphs Abs: 2.4 10*3/uL (ref 0.7–4.0)
MCHC: 34.7 g/dL (ref 30.0–36.0)
MCV: 88.6 fl (ref 78.0–100.0)
Monocytes Absolute: 0.7 10*3/uL (ref 0.1–1.0)
Monocytes Relative: 6.4 % (ref 3.0–12.0)
NEUTROS ABS: 6.6 10*3/uL (ref 1.4–7.7)
Neutrophils Relative %: 65.1 % (ref 43.0–77.0)
PLATELETS: 324 10*3/uL (ref 150.0–400.0)
RBC: 5.37 Mil/uL (ref 4.22–5.81)
RDW: 12.3 % (ref 11.5–15.5)
WBC: 10.2 10*3/uL (ref 4.0–10.5)

## 2018-10-13 LAB — BASIC METABOLIC PANEL
BUN: 26 mg/dL — ABNORMAL HIGH (ref 6–23)
CO2: 27 mEq/L (ref 19–32)
Calcium: 9.2 mg/dL (ref 8.4–10.5)
Chloride: 102 mEq/L (ref 96–112)
Creatinine, Ser: 1.44 mg/dL (ref 0.40–1.50)
GFR: 52.16 mL/min — AB (ref >60.00)
Glucose, Bld: 76 mg/dL (ref 70–99)
Potassium: 4 mEq/L (ref 3.5–5.1)
Sodium: 138 mEq/L (ref 135–145)

## 2018-10-13 LAB — LIPID PANEL
CHOL/HDL RATIO: 4
Cholesterol: 160 mg/dL (ref 0–200)
HDL: 35.9 mg/dL — ABNORMAL LOW (ref >39.00)
LDL Cholesterol: 90 mg/dL (ref 0–99)
NonHDL: 124.43
Triglycerides: 170 mg/dL — ABNORMAL HIGH (ref 0.0–149.0)
VLDL: 34 mg/dL (ref 0.0–40.0)

## 2018-10-13 LAB — HEPATIC FUNCTION PANEL
ALBUMIN: 4.1 g/dL (ref 3.5–5.2)
ALT: 19 U/L (ref 0–53)
AST: 14 U/L (ref 0–37)
Alkaline Phosphatase: 86 U/L (ref 39–117)
Bilirubin, Direct: 0 mg/dL (ref 0.0–0.3)
Total Bilirubin: 0.3 mg/dL (ref 0.2–1.2)
Total Protein: 6.9 g/dL (ref 6.0–8.3)

## 2018-10-13 LAB — HEMOGLOBIN A1C: HEMOGLOBIN A1C: 5.4 % (ref 4.6–6.5)

## 2018-10-13 LAB — PSA, TOTAL AND FREE
PSA, % Free: 17 % (calc) — ABNORMAL LOW (ref >25)
PSA, FREE: 0.2 ng/mL
PSA, Total: 1.2 ng/mL (ref <=4.0)

## 2018-10-17 NOTE — Progress Notes (Signed)
Dr. Frederico Hamman T. Ranen Doolin, MD, [facility] Primary Care and Sports Medicine [place], [address] Phone: [phone] Fax: [phone]  10/19/2018  Patient: Vincent Winters, MRN: [id number], DOB: 08-08-1971, 48 y.o.  Primary Physician:  Owens Loffler, MD   Chief Complaint  Patient presents with  . Annual Exam   Subjective:   Vincent Winters is a 48 y.o. pleasant patient who presents with the following:  Preventative Health Maintenance Visit:  Health Maintenance Summary Reviewed and updated, unless pt declines services.  Tobacco History Reviewed. Alcohol: No concerns, no excessive use Exercise Habits: Some activity, rec at least 30 mins 5 times a week STD concerns: no risk or activity to increase risk Drug Use: None Encouraged self-testicular check  Chest XR Start Flovent    Health Maintenance  Topic Date Due  . HIV Screening  11/14/1984  . TETANUS/TDAP  08/28/2026  . INFLUENZA VACCINE  Completed   Immunization History  Administered Date(s) Administered  . Influenza Inj Mdck Quad With Preservative 08/17/2018  . Influenza,inj,Quad PF,6+ Mos 06/10/2016  . Influenza-Unspecified 05/27/2015, 07/26/2017  . Tdap 08/28/2016   Patient Active Problem List   Diagnosis Date Noted  . 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 Hypertension 09/07/2009  . Asthma 09/23/2008  . GERD 09/23/2008  . HIATAL HERNIA 09/23/2008   Past Medical History:  Diagnosis Date  . Asthma   . GERD (gastroesophageal reflux disease)   . Hiatal hernia   . Hypertension 09/07/2009   Qualifier: Diagnosis of  By: Lorelei Pont MD, Frederico Hamman     Past Surgical History:  Procedure Laterality Date  . APPENDECTOMY     Social History   Socioeconomic History  . Marital status: Single    Spouse name: Not on file  . Number of children: Not on file  . Years of education: Not on file  . Highest education level: Not on file  Occupational History  .  Occupation: MFG Lobbyist: [employer]  Social Needs  . Financial resource strain: Not on file  . Food insecurity:    Worry: Not on file    Inability: Not on file  . Transportation needs:    Medical: Not on file    Non-medical: Not on file  Tobacco Use  . Smoking status: Never Smoker  . Smokeless tobacco: Never Used  Substance and Sexual Activity  . Alcohol use: Yes  . Drug use: No  . Sexual activity: Not on file  Lifestyle  . Physical activity:    Days per week: Not on file    Minutes per session: Not on file  . Stress: Not on file  Relationships  . Social connections:    Talks on phone: Not on file    Gets together: Not on file    Attends religious service: Not on file    Active member of club or organization: Not on file    Attends meetings of clubs or organizations: Not on file    Relationship status: Not on file  . Intimate partner violence:    Fear of current or ex partner: Not on file    Emotionally abused: Not on file    Physically abused: Not on file    Forced sexual activity: Not on file  Other Topics Concern  . Not on file  Social History Narrative   Regular exercise-yes      From [place] state   Family History  Problem Relation Age of Onset  .  Hypertension Father    No Known Allergies  Medication list has been reviewed and updated.   General: Denies fever, chills, sweats. No significant weight loss. Eyes: Denies blurring,significant itching ENT: Denies earache, sore throat, and hoarseness. Cardiovascular: Denies chest pains, palpitations, dyspnea on exertion Respiratory: STILL WITH ONGOING COUGH Breast: no concerns about lumps GI: Denies nausea, vomiting, diarrhea, constipation, change in bowel habits, abdominal pain, melena, hematochezia GU: Denies penile discharge, ED, urinary flow / outflow problems. No STD concerns. Musculoskeletal: Denies back pain, joint pain Derm: Denies rash, itching Neuro: Denies  paresthesias, frequent falls,  frequent headaches Psych: Denies depression, anxiety Endocrine: Denies cold intolerance, heat intolerance, polydipsia Heme: Denies enlarged lymph nodes Allergy: No hayfever  Objective:   BP 120/90   Pulse 77   Temp 98.3 F (36.8 C) (Oral)   Ht 5' 7"  (1.702 m)   Wt 194 lb 8 oz (88.2 kg)   SpO2 98%   BMI 30.46 kg/m  Ideal Body Weight: Weight in (lb) to have BMI = 25: 159.3  No exam data present  GEN: well developed, well nourished, no acute distress Eyes: conjunctiva and lids normal, PERRLA, EOMI ENT: TM clear, nares clear, oral exam WNL Neck: supple, no lymphadenopathy, no thyromegaly, no JVD Pulm: clear to auscultation and percussion, respiratory effort normal CV: regular rate and rhythm, S1-S2, no murmur, rub or gallop, no bruits, peripheral pulses normal and symmetric, no cyanosis, clubbing, edema or varicosities GI: soft, non-tender; no hepatosplenomegaly, masses; active bowel sounds all quadrants GU: no hernia, testicular mass, penile discharge Lymph: no cervical, axillary or inguinal adenopathy MSK: gait normal, muscle tone and strength WNL, no joint swelling, effusions, discoloration, crepitus  SKIN: clear, good turgor, color WNL, no rashes, lesions, or ulcerations Neuro: normal mental status, normal strength, sensation, and motion Psych: alert; oriented to person, place and time, normally interactive and not anxious or depressed in appearance. All labs reviewed with patient.  Lipids: Lab Results  Component Value Date   CHOL 160 10/12/2018   Lab Results  Component Value Date   HDL 35.90 (L) 10/12/2018   Lab Results  Component Value Date   LDLCALC 90 10/12/2018   Lab Results  Component Value Date   TRIG 170.0 (H) 10/12/2018   Lab Results  Component Value Date   CHOLHDL 4 10/12/2018   CBC: CBC Latest Ref Rng & Units 10/12/2018 08/28/2016 04/29/2014  WBC 4.0 - 10.5 K/uL 10.2 6.8 7.0  Hemoglobin 13.0 - 17.0 g/dL 16.5 16.9 15.7  Hematocrit 39.0 - 52.0 % 47.6  48.1 46.5  Platelets 150.0 - 400.0 K/uL 324.0 260.0 160    Basic Metabolic Panel:    Component Value Date/Time   NA 138 10/12/2018 1600   NA 136 04/29/2014 1747   K 4.0 10/12/2018 1600   K 3.8 04/29/2014 1747   CL 102 10/12/2018 1600   CL 104 04/29/2014 1747   CO2 27 10/12/2018 1600   CO2 25 04/29/2014 1747   BUN 26 (H) 10/12/2018 1600   BUN 11 04/29/2014 1747   CREATININE 1.44 10/12/2018 1600   CREATININE 1.37 (H) 04/29/2014 1747   GLUCOSE 76 10/12/2018 1600   GLUCOSE 87 04/29/2014 1747   CALCIUM 9.2 10/12/2018 1600   CALCIUM 8.5 04/29/2014 1747   Hepatic Function Latest Ref Rng & Units 10/12/2018 08/28/2016 04/29/2014  Total Protein 6.0 - 8.3 g/dL 6.9 6.8 6.8  Albumin 3.5 - 5.2 g/dL 4.1 4.4 3.8  AST 0 - 37 U/L 14 17 13(L)  ALT 0 - 53  U/L 19 26 23   Alk Phosphatase 39 - 117 U/L 86 75 77  Total Bilirubin 0.2 - 1.2 mg/dL 0.3 0.4 0.5  Bilirubin, Direct 0.0 - 0.3 mg/dL 0.0 0.1 -    Lab Results  Component Value Date   HGBA1C 5.4 10/12/2018   No results found for: TSH No results found for: PSA  Assessment and Plan:   Healthcare maintenance  Chronic cough - Plan: DG Chest 2 View  Health Maintenance Exam: The patient's preventative maintenance and recommended screening tests for an annual wellness exam were reviewed in full today. Brought up to date unless services declined.  Counselled on the importance of diet, exercise, and its role in overall health and mortality. The patient's FH and SH was reviewed, including their home life, tobacco status, and drug and alcohol status.  Follow-up in 1 year for physical exam or additional follow-up below.  Patient Instructions  Try the nose spray - 2 sprays each nostril once a day  Restart the Flovent 1 puff twice a day    Follow-up: No follow-ups on file. Or follow-up in 1 year if not noted.  Signed,  Maud Deed. Meliton Samad, MD   Allergies as of 10/19/2018   No Known Allergies     Medication List       Accurate as of  October 19, 2018 11:00 AM. Always use your most recent med list.        albuterol 108 (90 Base) MCG/ACT inhaler Commonly known as:  PROVENTIL HFA;VENTOLIN HFA Inhale 2 puffs into the lungs every 6 (six) hours as needed.   amLODipine 5 MG tablet Commonly known as:  NORVASC TAKE 1 TABLET DAILY   fluticasone 110 MCG/ACT inhaler Commonly known as:  FLOVENT HFA Inhale 1 puff into the lungs 2 (two) times daily.   fluticasone 50 MCG/ACT nasal spray Commonly known as:  FLONASE Place 2 sprays into both nostrils daily.   ibuprofen 200 MG tablet Commonly known as:  ADVIL,MOTRIN Take 400 mg by mouth every 6 (six) hours as needed.   omeprazole 20 MG capsule Commonly known as:  PRILOSEC Take 20 mg by mouth daily.   triamcinolone cream 0.1 % Commonly known as:  KENALOG Apply 1 application topically 2 (two) times daily.

## 2018-10-19 ENCOUNTER — Ambulatory Visit (INDEPENDENT_AMBULATORY_CARE_PROVIDER_SITE_OTHER): Payer: BLUE CROSS/BLUE SHIELD | Admitting: Family Medicine

## 2018-10-19 ENCOUNTER — Ambulatory Visit (INDEPENDENT_AMBULATORY_CARE_PROVIDER_SITE_OTHER)
Admission: RE | Admit: 2018-10-19 | Discharge: 2018-10-19 | Disposition: A | Discharge status: Home / Self Care | Payer: BLUE CROSS/BLUE SHIELD | Source: Ambulatory Visit | Attending: Family Medicine | Admitting: Family Medicine

## 2018-10-19 ENCOUNTER — Encounter: Payer: Self-pay | Admitting: Family Medicine

## 2018-10-19 VITALS — BP 120/90 | HR 77 | Temp 98.3°F | Ht 67.0 in | Wt 194.5 lb

## 2018-10-19 DIAGNOSIS — R053 Chronic cough: Secondary | ICD-10-CM

## 2018-10-19 DIAGNOSIS — R05 Cough: Secondary | ICD-10-CM | POA: Diagnosis not present

## 2018-10-19 DIAGNOSIS — Z0000 Encounter for general adult medical examination without abnormal findings: Secondary | ICD-10-CM | POA: Diagnosis not present

## 2018-10-19 MED ORDER — FLUTICASONE PROPIONATE 50 MCG/ACT NA SUSP: 2.0000 | Freq: Every day | NASAL | 6 refills | Status: DC

## 2018-10-19 NOTE — Patient Instructions (Signed)
Try the nose spray - 2 sprays each nostril once a day  Restart the Flovent 1 puff twice a day

## 2018-10-26 ENCOUNTER — Encounter: Payer: Self-pay | Admitting: Family Medicine

## 2018-11-09 ENCOUNTER — Other Ambulatory Visit: Payer: Self-pay | Admitting: Family Medicine

## 2018-11-23 ENCOUNTER — Encounter: Payer: Self-pay | Admitting: Family Medicine

## 2018-11-23 DIAGNOSIS — S39012A Strain of muscle, fascia and tendon of lower back, initial encounter: Secondary | ICD-10-CM

## 2018-11-23 MED ORDER — TIZANIDINE HCL 4 MG PO TABS: 4.0000 mg | ORAL_TABLET | Freq: Every evening | ORAL | 0 refills | Status: AC | PRN

## 2018-11-25 ENCOUNTER — Encounter: Payer: Self-pay | Admitting: Family Medicine

## 2018-11-25 ENCOUNTER — Ambulatory Visit (INDEPENDENT_AMBULATORY_CARE_PROVIDER_SITE_OTHER): Payer: BLUE CROSS/BLUE SHIELD | Admitting: Family Medicine

## 2018-11-25 ENCOUNTER — Other Ambulatory Visit: Payer: Self-pay

## 2018-11-25 DIAGNOSIS — J069 Acute upper respiratory infection, unspecified: Secondary | ICD-10-CM | POA: Diagnosis not present

## 2018-11-25 DIAGNOSIS — M545 Low back pain, unspecified: Secondary | ICD-10-CM

## 2018-11-25 MED ORDER — PREDNISONE 20 MG PO TABS: ORAL_TABLET | ORAL | 0 refills | Status: DC

## 2018-11-25 NOTE — Progress Notes (Signed)
Vincent Schuff T. Leighana Neyman, MD Primary Care and Sports Medicine [facility] [address] [place], [address] Phone: [phone]  FAX: [phone]  Vincent Winters - 48 y.o. male  MRN [id number]  Date of Birth: 08-21-1971  Visit Date: 11/25/2018  PCP: Hannah Beat, MD  Referred by: Hannah Beat, MD  Virtual Visit via Video Note:  I connected with  Loralie Champagne on 11/25/2018  8:20 AM EDT by a video enabled telemedicine application and verified that I am speaking with the correct person using two identifiers.   Location patient: home phone or cell phone Location provider: work or home office Consent: Verbal consent directly obtained from [employer]. Persons participating in the virtual visit: patient, provider  I discussed the limitations of evaluation and management by telemedicine and the availability of in person appointments. The patient expressed understanding and agreed to proceed.  History of Present Illness:  Vincent Winters is here basically because he went to his chiropractor yesterday.  He had a 99.7 temperature on intake, and they asked him to follow-up with his doctor.  He is basically asymptomatic otherwise, except he does have a dry cough that is been ongoing now for really a long time.  No productive sputum and no shortness of breath.  Minimal sore throat.  No earache, no nausea, vomiting, or new rashes.  He has been having some ongoing back pain, and he communicated with me before about this.  He fell off of his toilet and hurt himself at that point.  He is not having any radiculopathy.  He did bang his head when he was falling off the toilet, but is not having any mental status changes, vertigo, trouble with concentration, headache, or nausea.  Review of Systems as above: See pertinent positives and pertinent negatives per HPI No acute distress verbally  Past Medical History, Surgical History, Social  History, Family History, Problem List, Medications, and Allergies have been reviewed and updated if relevant.   Observations/Objective/Exam:  An attempt was made to discern vital signs over the phone and per patient if applicable and possible.   General:    Alert, Oriented, appears well and in no acute distress HEENT:     Atraumatic, conjunctiva clear, no obvious abnormalities on inspection of external nose and ears.  Neck:    Normal movements of the head and neck Pulmonary:     On inspection no signs of respiratory distress, breathing rate appears normal, no obvious gross SOB, gasping or wheezing Cardiovascular:    No obvious cyanosis Musculoskeletal:    Moves all visible extremities without noticeable abnormality Psych / Neurological:     Pleasant and cooperative, no obvious depression or anxiety, speech and thought processing grossly intact  Assessment and Plan:  URI, acute  Acute bilateral low back pain without sciatica  >15 minutes spent in face to face time with patient, >50% spent in counselling or coordination of care:  Cough is dry, no true temperature, minimal ST, he does not feel that bad overall, and he would not have coughed except his chiropractor asked him to.  Heating pad, rehab, pulse steroids, zanaflex.  No concern about viral symptoms, or minimal.  I discussed the assessment and treatment plan with the patient. The patient was provided an opportunity to ask questions and all were answered. The patient agreed with the plan and demonstrated an understanding of the instructions.   The patient was advised to call back or seek an in-person evaluation  if the symptoms worsen or if the condition fails to improve as anticipated.  Follow-up: prn unless noted otherwise below No follow-ups on file.  Meds ordered this encounter  Medications  . predniSONE (DELTASONE) 20 MG tablet    Sig: 2 tabs po daily for 5 days, then 1 tab po daily for 5 days    Dispense:  15  tablet    Refill:  0   No orders of the defined types were placed in this encounter.   Signed,  Elpidio Galea. Markeem Noreen, MD

## 2018-12-07 ENCOUNTER — Other Ambulatory Visit: Payer: Self-pay | Admitting: Family Medicine

## 2019-03-22 ENCOUNTER — Encounter: Payer: Self-pay | Admitting: Family Medicine

## 2019-03-22 ENCOUNTER — Other Ambulatory Visit: Payer: Self-pay

## 2019-03-22 ENCOUNTER — Ambulatory Visit: Payer: BC Managed Care – PPO | Admitting: Family Medicine

## 2019-03-22 VITALS — BP 134/86 | HR 87 | Temp 98.4°F | Ht 67.0 in | Wt 190.2 lb

## 2019-03-22 DIAGNOSIS — R1030 Lower abdominal pain, unspecified: Secondary | ICD-10-CM | POA: Diagnosis not present

## 2019-03-22 LAB — COMPREHENSIVE METABOLIC PANEL
ALT: 19 U/L (ref 0–53)
AST: 15 U/L (ref 0–37)
Albumin: 4.3 g/dL (ref 3.5–5.2)
Alkaline Phosphatase: 104 U/L (ref 39–117)
BUN: 19 mg/dL (ref 6–23)
CO2: 29 mEq/L (ref 19–32)
Calcium: 9.3 mg/dL (ref 8.4–10.5)
Chloride: 101 mEq/L (ref 96–112)
Creatinine, Ser: 1.34 mg/dL (ref 0.40–1.50)
GFR: 56.58 mL/min — ABNORMAL LOW (ref >60.00)
Glucose, Bld: 100 mg/dL — ABNORMAL HIGH (ref 70–99)
Potassium: 4.3 mEq/L (ref 3.5–5.1)
Sodium: 138 mEq/L (ref 135–145)
Total Bilirubin: 0.6 mg/dL (ref 0.2–1.2)
Total Protein: 6.7 g/dL (ref 6.0–8.3)

## 2019-03-22 LAB — POC URINALSYSI DIPSTICK (AUTOMATED)
Blood, UA: NEGATIVE
Glucose, UA: NEGATIVE
Leukocytes, UA: NEGATIVE
Nitrite, UA: NEGATIVE
Protein, UA: POSITIVE — AB
Spec Grav, UA: >=1.03 — AB (ref 1.010–1.025)
Urobilinogen, UA: 0.2 E.U./dL
pH, UA: 5.5 (ref 5.0–8.0)

## 2019-03-22 LAB — CBC WITH DIFFERENTIAL/PLATELET
Basophils Absolute: 0 10*3/uL (ref 0.0–0.1)
Basophils Relative: 0.3 % (ref 0.0–3.0)
Eosinophils Absolute: 0.1 10*3/uL (ref 0.0–0.7)
Eosinophils Relative: 1.3 % (ref 0.0–5.0)
HCT: 48.6 % (ref 39.0–52.0)
Hemoglobin: 16.6 g/dL (ref 13.0–17.0)
Lymphocytes Relative: 13.8 % (ref 12.0–46.0)
Lymphs Abs: 1.3 10*3/uL (ref 0.7–4.0)
MCHC: 34.1 g/dL (ref 30.0–36.0)
MCV: 89.7 fl (ref 78.0–100.0)
Monocytes Absolute: 0.8 10*3/uL (ref 0.1–1.0)
Monocytes Relative: 8.5 % (ref 3.0–12.0)
Neutro Abs: 7.3 10*3/uL (ref 1.4–7.7)
Neutrophils Relative %: 76.1 % (ref 43.0–77.0)
Platelets: 256 10*3/uL (ref 150.0–400.0)
RBC: 5.42 Mil/uL (ref 4.22–5.81)
RDW: 13 % (ref 11.5–15.5)
WBC: 9.6 10*3/uL (ref 4.0–10.5)

## 2019-03-22 LAB — LIPASE: Lipase: 18 U/L (ref 11.0–59.0)

## 2019-03-22 NOTE — Assessment & Plan Note (Addendum)
With largely mild symptoms that are improving. ?UTI vs diverticulitis vs GI upset to something he ate. Check UA today and if normal check labs for intraabd process (CBC, CMP, lipase). rec bland diet over next 2-3 days to see if any improvement. Update if worsening. Pt agrees with plan.

## 2019-03-22 NOTE — Patient Instructions (Signed)
Urine looking ok today Labs today - we will be in touch with results. I recommend bland diet over the next few days (bananas, rice, applesauce, toast) to give the bowels a rest. Let us know if not improving with this.

## 2019-03-22 NOTE — Progress Notes (Signed)
This visit was conducted in person.  BP 134/86 (BP Location: Left Arm, Patient Position: Sitting, Cuff Size: Normal)   Pulse 87   Temp 98.4 F (36.9 C) (Temporal)   Ht 5\' 7"  (1.702 m)   Wt 190 lb 3 oz (86.3 kg)   SpO2 98%   BMI 29.79 kg/m    CC: "abdominal issues" Subjective:    Patient ID: Vincent Winters, male    DOB: 1971/04/20, 48 y.o.   MRN: [id number]  HPI: Vincent ChampagneDaniel James Arciga is a 48 y.o. male presenting on 03/22/2019 for Abdominal Pain (C/o low to mid abd pain.  Started last week. Also, c/o mild nausea.  Denies vomiting, diarrhea or fever. Says he could feel movement sensation just before a BM or urinating.  Tried Tylenol. )   1 wk h/o lower abd discomfort associated with mild nausea. Feels "tickling feeling" and mild pressure pain associated with movement prior to BM or urination. Over weekend lower abd discomfort/pressure woke him up - voided/BM and felt better. Some urinary urgency and hesitancy. May have started after reheating roasted chicken.   Describes looser than normal stools - soft light brown over last 3-4 days of thin caliber and small amt.   No fevers/chills, vomiting, constipation, diarrhea, blood or mucous in stool. No dysuria, frequency, hematuria. No rectal or lower back pain. No unexpected weight loss, night sweats. No urethral discharge or genital rashes.  No dietary changes recently. No recent travel. Uses well water - uses particle filter at home. No new medicines/supplements.  No sick contacts at home.  No known food intolerances.  No significant GERD - managed on daily omeprazole.      Relevant past medical, surgical, family and social history reviewed and updated as indicated. Interim medical history since our last visit reviewed. Allergies and medications reviewed and updated. Outpatient Medications Prior to Visit  Medication Sig Dispense Refill  . albuterol (PROVENTIL HFA;VENTOLIN HFA) 108 (90 Base) MCG/ACT inhaler INHALE 2 PUFFS  INTO THE LUNGS EVERY 6 HOURS AS NEEDED 25.5 g 1  . amLODipine (NORVASC) 5 MG tablet TAKE 1 TABLET DAILY 90 tablet 1  . FLOVENT HFA 110 MCG/ACT inhaler INHALE 1 PUFF BY MOUTH TWICE DAILY (Patient taking differently: As needed) 36 g 3  . ibuprofen (ADVIL,MOTRIN) 200 MG tablet Take 400 mg by mouth every 6 (six) hours as needed.      Marland Kitchen. omeprazole (PRILOSEC) 20 MG capsule Take 20 mg by mouth daily.    Marland Kitchen. triamcinolone cream (KENALOG) 0.1 % Apply 1 application topically 2 (two) times daily. 454 g 1  . fluticasone (FLONASE) 50 MCG/ACT nasal spray Place 2 sprays into both nostrils daily. 16 g 6  . predniSONE (DELTASONE) 20 MG tablet 2 tabs po daily for 5 days, then 1 tab po daily for 5 days 15 tablet 0   No facility-administered medications prior to visit.      Per HPI unless specifically indicated in ROS section below Review of Systems Objective:    BP 134/86 (BP Location: Left Arm, Patient Position: Sitting, Cuff Size: Normal)   Pulse 87   Temp 98.4 F (36.9 C) (Temporal)   Ht 5\' 7"  (1.702 m)   Wt 190 lb 3 oz (86.3 kg)   SpO2 98%   BMI 29.79 kg/m   Wt Readings from Last 3 Encounters:  03/22/19 190 lb 3 oz (86.3 kg)  10/19/18 194 lb 8 oz (88.2 kg)  10/05/18 192 lb 12 oz (87.4 kg)    Physical Exam  Vitals signs and nursing note reviewed.  Constitutional:      Appearance: Normal appearance. He is not ill-appearing.  Neck:     Thyroid: No thyromegaly.  Cardiovascular:     Rate and Rhythm: Normal rate and regular rhythm.     Pulses: Normal pulses.     Heart sounds: Normal heart sounds. No murmur.  Pulmonary:     Effort: Pulmonary effort is normal. No respiratory distress.     Breath sounds: Normal breath sounds. No wheezing, rhonchi or rales.  Abdominal:     General: Abdomen is flat. Bowel sounds are normal. There is no distension.     Palpations: Abdomen is soft. There is no mass.     Tenderness: There is abdominal tenderness (mild) in the suprapubic area. There is no right CVA  tenderness, left CVA tenderness, guarding or rebound.     Hernia: No hernia is present.  Musculoskeletal:     Right lower leg: No edema.     Left lower leg: No edema.  Skin:    General: Skin is warm and dry.  Psychiatric:        Mood and Affect: Mood normal.       Results for orders placed or performed in visit on 03/22/19  POCT Urinalysis Dipstick (Automated)  Result Value Ref Range   Color, UA dark yellow    Clarity, UA clear    Glucose, UA Negative Negative   Bilirubin, UA 1+    Ketones, UA +/-    Spec Grav, UA >=1.030 (A) 1.010 - 1.025   Blood, UA negative    pH, UA 5.5 5.0 - 8.0   Protein, UA Positive (A) Negative   Urobilinogen, UA 0.2 0.2 or 1.0 E.U./dL   Nitrite, UA negative    Leukocytes, UA Negative Negative   Assessment & Plan:   Problem List Items Addressed This Visit    Lower abdominal pain - Primary    With largely mild symptoms that are improving. ?UTI vs diverticulitis vs GI upset to something he ate. Check UA today and if normal check labs for intraabd process (CBC, CMP, lipase). rec bland diet over next 2-3 days to see if any improvement. Update if worsening. Pt agrees with plan.       Relevant Orders   POCT Urinalysis Dipstick (Automated) (Completed)   Comprehensive metabolic panel   CBC with Differential/Platelet   Lipase       No orders of the defined types were placed in this encounter.  Orders Placed This Encounter  Procedures  . Comprehensive metabolic panel  . CBC with Differential/Platelet  . Lipase  . POCT Urinalysis Dipstick (Automated)    Follow up plan: Return if symptoms worsen or fail to improve.  Ria Bush, MD

## 2019-06-14 ENCOUNTER — Other Ambulatory Visit: Payer: Self-pay | Admitting: Family Medicine

## 2019-06-25 ENCOUNTER — Telehealth: Payer: Self-pay

## 2019-06-25 ENCOUNTER — Other Ambulatory Visit: Payer: Self-pay

## 2019-06-25 ENCOUNTER — Emergency Department: Payer: BC Managed Care – PPO

## 2019-06-25 ENCOUNTER — Emergency Department
Admission: EM | Admit: 2019-06-25 | Discharge: 2019-06-25 | Disposition: A | Discharge status: Home / Self Care | Payer: BC Managed Care – PPO | Attending: Emergency Medicine | Admitting: Emergency Medicine

## 2019-06-25 DIAGNOSIS — I10 Essential (primary) hypertension: Secondary | ICD-10-CM | POA: Insufficient documentation

## 2019-06-25 DIAGNOSIS — Z7982 Long term (current) use of aspirin: Secondary | ICD-10-CM | POA: Diagnosis not present

## 2019-06-25 DIAGNOSIS — Z79899 Other long term (current) drug therapy: Secondary | ICD-10-CM | POA: Insufficient documentation

## 2019-06-25 DIAGNOSIS — J45909 Unspecified asthma, uncomplicated: Secondary | ICD-10-CM | POA: Insufficient documentation

## 2019-06-25 DIAGNOSIS — R0789 Other chest pain: Secondary | ICD-10-CM | POA: Insufficient documentation

## 2019-06-25 DIAGNOSIS — R079 Chest pain, unspecified: Secondary | ICD-10-CM | POA: Diagnosis not present

## 2019-06-25 LAB — TROPONIN I (HIGH SENSITIVITY)
Troponin I (High Sensitivity): 5 ng/L (ref <18)
Troponin I (High Sensitivity): 4 ng/L (ref <18)

## 2019-06-25 LAB — BASIC METABOLIC PANEL
Anion gap: 11 (ref 5–15)
BUN: 18 mg/dL (ref 6–20)
CO2: 24 mmol/L (ref 22–32)
Calcium: 9.3 mg/dL (ref 8.9–10.3)
Chloride: 102 mmol/L (ref 98–111)
Creatinine, Ser: 1.22 mg/dL (ref 0.61–1.24)
GFR calc Af Amer: >60 mL/min (ref >60)
GFR calc non Af Amer: >60 mL/min (ref >60)
Glucose, Bld: 119 mg/dL — ABNORMAL HIGH (ref 70–99)
Potassium: 4.1 mmol/L (ref 3.5–5.1)
Sodium: 137 mmol/L (ref 135–145)

## 2019-06-25 LAB — CBC
HCT: 50.4 % (ref 39.0–52.0)
Hemoglobin: 17.3 g/dL — ABNORMAL HIGH (ref 13.0–17.0)
MCH: 29.9 pg (ref 26.0–34.0)
MCHC: 34.3 g/dL (ref 30.0–36.0)
MCV: 87.2 fL (ref 80.0–100.0)
Platelets: 263 10*3/uL (ref 150–400)
RBC: 5.78 MIL/uL (ref 4.22–5.81)
RDW: 12.2 % (ref 11.5–15.5)
WBC: 8.2 10*3/uL (ref 4.0–10.5)
nRBC: 0 % (ref 0.0–0.2)

## 2019-06-25 MED ORDER — SODIUM CHLORIDE 0.9% FLUSH: 3.0000 mL | Freq: Once | INTRAVENOUS | Status: DC

## 2019-06-25 MED ORDER — KETOROLAC TROMETHAMINE 30 MG/ML IJ SOLN
30.0000 mg | Freq: Once | INTRAMUSCULAR | Status: AC
  Administered 2019-06-25: 11:00:00 30 mg via INTRAMUSCULAR
  Filled 2019-06-25: qty 1

## 2019-06-25 MED ORDER — IOHEXOL 350 MG/ML SOLN
75.0000 mL | Freq: Once | INTRAVENOUS | Status: AC | PRN
  Administered 2019-06-25: 75 mL via INTRAVENOUS

## 2019-06-25 NOTE — Telephone Encounter (Signed)
Pt started in early AM with sharp to dull lt side CP that goes under lt arm.pain level is 6. Pain is on and off. Pt had similar pain one month ago. No radiation of pain into neck, jaw, shoulder or arm. No fever but has chills,, non prod cough, lightheaded. No other covid symptoms. Pt will go to [facility] ED now. FYI to Dr Lorelei Pont.

## 2019-06-25 NOTE — ED Provider Notes (Signed)
[facility] Emergency Department Provider Note   ____________________________________________    I have reviewed the triage vital signs and the nursing notes.   HISTORY  Chief Complaint Chest Pain     HPI Vincent Winters is a 48 y.o. male who presents with complaints of left-sided chest pain.  Patient reports he woke up this morning with chest pain in his left lateral chest.  He reports the pain was sharp in nature and now is somewhat intermittent, no discernible pattern.  No fevers or chills.  No cough.  No shortness of breath.  No history of blood clots.  No nausea vomiting or diaphoresis.  Past Medical History:  Diagnosis Date  . Asthma   . GERD (gastroesophageal reflux disease)   . Hiatal hernia   . Hypertension 09/07/2009   Qualifier: Diagnosis of  By: Patsy Lager MD, Spencer      Patient Active Problem List   Diagnosis Date Noted  . Lower abdominal pain 03/22/2019  . 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 Hypertension 09/07/2009  . Asthma 09/23/2008  . GERD 09/23/2008  . HIATAL HERNIA 09/23/2008    Past Surgical History:  Procedure Laterality Date  . APPENDECTOMY      Prior to Admission medications   Medication Sig Start Date End Date Taking? Authorizing Provider  albuterol (PROVENTIL HFA;VENTOLIN HFA) 108 (90 Base) MCG/ACT inhaler INHALE 2 PUFFS INTO THE LUNGS EVERY 6 HOURS AS NEEDED 11/10/18  Yes Copland, Karleen Hampshire, MD  amLODipine (NORVASC) 5 MG tablet TAKE 1 TABLET DAILY 06/14/19  Yes Copland, Spencer, MD  aspirin EC 81 MG tablet Take 81 mg by mouth daily.   Yes [provider]  FLOVENT HFA 110 MCG/ACT inhaler INHALE 1 PUFF BY MOUTH TWICE DAILY Patient taking differently: Inhale 1 puff into the lungs 2 (two) times daily as needed.  11/10/18  Yes Copland, Karleen Hampshire, MD  triamcinolone cream (KENALOG) 0.1 % Apply 1 application topically 2 (two) times daily. 10/17/14  Yes Copland, Karleen Hampshire, MD     Allergies  Patient has no known allergies.  Family History  Problem Relation Age of Onset  . Hypertension Father     Social History Social History   Tobacco Use  . Smoking status: Never Smoker  . Smokeless tobacco: Never Used  Substance Use Topics  . Alcohol use: Yes  . Drug use: No    Review of Systems  Constitutional: No fever/chills Eyes: No visual changes.  ENT: No sore throat. Cardiovascular: As above Respiratory: As above Gastrointestinal: No abdominal pain.  No nausea, no vomiting.   Genitourinary: Negative for dysuria. Musculoskeletal: Negative for back pain. Skin: Negative for rash. Neurological: Negative for headaches or weakness   ____________________________________________   PHYSICAL EXAM:  VITAL SIGNS: ED Triage Vitals  Enc Vitals Group     BP 06/25/19 0849 (!) 163/88     Pulse Rate 06/25/19 0849 (!) 101     Resp 06/25/19 0849 18     Temp 06/25/19 0849 98.8 F (37.1 C)     Temp Source 06/25/19 0849 Oral     SpO2 06/25/19 0849 97 %     Weight 06/25/19 0846 86.2 kg (190 lb)     Height 06/25/19 0846 1.753 m (5\' 9" )     Head Circumference --      Peak Flow --      Pain Score 06/25/19 0846 0     Pain Loc --      Pain Edu? --  Excl. in [place]? --     Constitutional: Alert and oriented.   Nose: No congestion/rhinnorhea. Mouth/Throat: Mucous membranes are moist.    Cardiovascular: Normal rate, regular rhythm. Grossly normal heart sounds.  Good peripheral circulation.  No chest wall tenderness palpation Respiratory: Normal respiratory effort.  No retractions. Lungs CTAB. Gastrointestinal: Soft and nontender. No distention.    Musculoskeletal:  Warm and well perfused Neurologic:  Normal speech and language. No gross focal neurologic deficits are appreciated.  Skin:  Skin is warm, dry and intact. No rash noted. Psychiatric: Mood and affect are normal. Speech and behavior are normal.  ____________________________________________   LABS (all labs  ordered are listed, but only abnormal results are displayed)  Labs Reviewed  BASIC METABOLIC PANEL - Abnormal; Notable for the following components:      Result Value   Glucose, Bld 119 (*)    All other components within normal limits  CBC - Abnormal; Notable for the following components:   Hemoglobin 17.3 (*)    All other components within normal limits  TROPONIN I (HIGH SENSITIVITY)  TROPONIN I (HIGH SENSITIVITY)   ____________________________________________  EKG  ED ECG REPORT I, Lavonia Drafts, the attending physician, personally viewed and interpreted this ECG.  Date: 06/25/2019  Rhythm: normal sinus rhythm QRS Axis: normal Intervals: normal ST/T Wave abnormalities: normal Narrative Interpretation: no evidence of acute ischemia  ____________________________________________  RADIOLOGY  Chest x-ray unremarkable CT angiography of the chest unremarkable ____________________________________________   PROCEDURES  Procedure(s) performed: No  Procedures   Critical Care performed: No ____________________________________________   INITIAL IMPRESSION / ASSESSMENT AND PLAN / ED COURSE  Pertinent labs & imaging results that were available during my care of the patient were reviewed by me and considered in my medical decision making (see chart for details).  Patient presents with left lateral chest pain, EKG is reassuring, not typical for ACS.  No tenderness to palpation to suggest musculoskeletal pain.  Chest x-ray, lab work is reassuring.  Pending second troponin, CT angiography  Second troponin and CT angiography is unremarkable, patient treated with pain medication with some improvement in the pain which has become less intermittent/less frequent  Given reassuring work-up, normal EKG, normal imaging, normal labs appropriate for discharge at this time with analgesics, outpatient follow-up with cardiology, return precautions discussed     ____________________________________________   FINAL CLINICAL IMPRESSION(S) / ED DIAGNOSES  Final diagnoses:  Atypical chest pain        Note:  This document was prepared using Dragon voice recognition software and may include unintentional dictation errors.   Lavonia Drafts, MD 06/25/19 1500

## 2019-06-25 NOTE — ED Notes (Signed)
Pt reports chest pain still intermittent but pain has dulled since getting toradol injection.

## 2019-06-25 NOTE — ED Notes (Signed)
Light green tube sent to lab 

## 2019-06-25 NOTE — ED Triage Notes (Signed)
Pt c/o nonradiating left lateral chest pain that woke him from sleep this morning, pt denies N/V/diaphoresis or SOB . Pt is in NAD. Ambulatory with a steady gait

## 2019-09-22 ENCOUNTER — Telehealth: Payer: Self-pay | Admitting: Family Medicine

## 2019-09-22 NOTE — Telephone Encounter (Signed)
Please schedule CPE with fasting labs for end of February/first on March for Dr. Patsy Lager.

## 2019-09-22 NOTE — Telephone Encounter (Signed)
Spoke with pt he stated he could not schedule right now work was very busy.  He will call back to schedule

## 2019-11-18 ENCOUNTER — Ambulatory Visit: Payer: BC Managed Care – PPO | Attending: Internal Medicine

## 2019-11-18 DIAGNOSIS — Z23 Encounter for immunization: Secondary | ICD-10-CM

## 2019-11-18 NOTE — Progress Notes (Signed)
   Covid-19 Vaccination Clinic  Name:  Vincent Winters    MRN: [id number] DOB: [birth year]  11/18/2019  Mr. Maloof was observed post Covid-19 immunization for 15 minutes without incident. He was provided with Vaccine Information Sheet and instruction to access the V-Safe system.   Mr. Coxe was instructed to call 911 with any severe reactions post vaccine: Marland Kitchen Difficulty breathing  . Swelling of face and throat  . A fast heartbeat  . A bad rash all over body  . Dizziness and weakness   Immunizations Administered    Name Date Dose VIS Date Route   Moderna COVID-19 Vaccine 11/18/2019 12:52 PM 0.5 mL 07/27/2019 Intramuscular   Manufacturer: Moderna   Lot: [id number]   NDC: 97673-419-37

## 2019-12-22 ENCOUNTER — Ambulatory Visit: Payer: BC Managed Care – PPO | Attending: Internal Medicine

## 2019-12-22 ENCOUNTER — Other Ambulatory Visit: Payer: Self-pay

## 2019-12-22 DIAGNOSIS — Z23 Encounter for immunization: Secondary | ICD-10-CM

## 2019-12-22 NOTE — Progress Notes (Signed)
   Covid-19 Vaccination Clinic  Name:  Vincent Winters    MRN: [id number] DOB: 1971/08/21  12/22/2019  Mr. [name] was observed post Covid-19 immunization for 30 minutes based on pre-vaccination screening without incident. He was provided with Vaccine Information Sheet and instruction to access the V-Safe system.   Mr. [name] was instructed to call 911 with any severe reactions post vaccine: Marland Kitchen Difficulty breathing  . Swelling of face and throat  . A fast heartbeat  . A bad rash all over body  . Dizziness and weakness   Immunizations Administered    Name Date Dose VIS Date Route   Moderna COVID-19 Vaccine 12/22/2019 11:05 AM 0.5 mL 07/2019 Intramuscular   Manufacturer: Moderna   Lot: [id number]   NDC: 71219-758-83

## 2019-12-27 ENCOUNTER — Other Ambulatory Visit: Payer: Self-pay | Admitting: Family Medicine

## 2019-12-27 NOTE — Telephone Encounter (Signed)
Last office visit 03/22/2019 with Dr. Reece Agar for abdominal pain.  Contacted patient back in January to schedule CPE with Dr. Patsy Lager but patient stated he couldn't schedule right now because work was so busy.  No future appointments.  Refill?

## 2020-01-10 DIAGNOSIS — Z20828 Contact with and (suspected) exposure to other viral communicable diseases: Secondary | ICD-10-CM | POA: Diagnosis not present

## 2020-01-10 DIAGNOSIS — Z03818 Encounter for observation for suspected exposure to other biological agents ruled out: Secondary | ICD-10-CM | POA: Diagnosis not present

## 2020-03-28 ENCOUNTER — Other Ambulatory Visit: Payer: Self-pay | Admitting: *Deleted

## 2020-03-28 MED ORDER — AMLODIPINE BESYLATE 5 MG PO TABS: 5.0000 mg | ORAL_TABLET | Freq: Every day | ORAL | 0 refills | Status: DC

## 2020-03-28 NOTE — Telephone Encounter (Signed)
Last office visit 03/22/2019 with Dr. Reece Agar for lower abdominal pain.  Last seen by PCP 11/25/2018.  Last refilled 12/27/2019 for #90 with no refills.  No future appointments.  Refill?

## 2020-05-17 ENCOUNTER — Other Ambulatory Visit: Payer: Self-pay | Admitting: Family Medicine

## 2020-05-17 DIAGNOSIS — Z79899 Other long term (current) drug therapy: Secondary | ICD-10-CM

## 2020-05-17 DIAGNOSIS — Z131 Encounter for screening for diabetes mellitus: Secondary | ICD-10-CM

## 2020-05-17 DIAGNOSIS — Z13220 Encounter for screening for lipoid disorders: Secondary | ICD-10-CM

## 2020-05-17 DIAGNOSIS — Z125 Encounter for screening for malignant neoplasm of prostate: Secondary | ICD-10-CM

## 2020-05-26 ENCOUNTER — Other Ambulatory Visit: Payer: Self-pay

## 2020-05-26 ENCOUNTER — Other Ambulatory Visit (INDEPENDENT_AMBULATORY_CARE_PROVIDER_SITE_OTHER): Payer: BC Managed Care – PPO

## 2020-05-26 DIAGNOSIS — Z131 Encounter for screening for diabetes mellitus: Secondary | ICD-10-CM

## 2020-05-26 DIAGNOSIS — Z79899 Other long term (current) drug therapy: Secondary | ICD-10-CM | POA: Diagnosis not present

## 2020-05-26 DIAGNOSIS — Z13220 Encounter for screening for lipoid disorders: Secondary | ICD-10-CM

## 2020-05-26 DIAGNOSIS — Z125 Encounter for screening for malignant neoplasm of prostate: Secondary | ICD-10-CM | POA: Diagnosis not present

## 2020-05-26 LAB — BASIC METABOLIC PANEL
BUN: 17 mg/dL (ref 6–23)
CO2: 30 mEq/L (ref 19–32)
Calcium: 9 mg/dL (ref 8.4–10.5)
Chloride: 102 mEq/L (ref 96–112)
Creatinine, Ser: 1.33 mg/dL (ref 0.40–1.50)
GFR: 56.79 mL/min — ABNORMAL LOW (ref >60.00)
Glucose, Bld: 101 mg/dL — ABNORMAL HIGH (ref 70–99)
Potassium: 4.4 mEq/L (ref 3.5–5.1)
Sodium: 137 mEq/L (ref 135–145)

## 2020-05-26 LAB — LIPID PANEL
Cholesterol: 172 mg/dL (ref 0–200)
HDL: 39.3 mg/dL (ref >39.00)
LDL Cholesterol: 118 mg/dL — ABNORMAL HIGH (ref 0–99)
NonHDL: 132.56
Total CHOL/HDL Ratio: 4
Triglycerides: 74 mg/dL (ref 0.0–149.0)
VLDL: 14.8 mg/dL (ref 0.0–40.0)

## 2020-05-26 LAB — CBC WITH DIFFERENTIAL/PLATELET
Basophils Absolute: 0 10*3/uL (ref 0.0–0.1)
Basophils Relative: 0.6 % (ref 0.0–3.0)
Eosinophils Absolute: 0.3 10*3/uL (ref 0.0–0.7)
Eosinophils Relative: 4.1 % (ref 0.0–5.0)
HCT: 46.2 % (ref 39.0–52.0)
Hemoglobin: 16 g/dL (ref 13.0–17.0)
Lymphocytes Relative: 24.3 % (ref 12.0–46.0)
Lymphs Abs: 1.7 10*3/uL (ref 0.7–4.0)
MCHC: 34.6 g/dL (ref 30.0–36.0)
MCV: 90 fl (ref 78.0–100.0)
Monocytes Absolute: 0.6 10*3/uL (ref 0.1–1.0)
Monocytes Relative: 9.2 % (ref 3.0–12.0)
Neutro Abs: 4.3 10*3/uL (ref 1.4–7.7)
Neutrophils Relative %: 61.8 % (ref 43.0–77.0)
Platelets: 231 10*3/uL (ref 150.0–400.0)
RBC: 5.14 Mil/uL (ref 4.22–5.81)
RDW: 13 % (ref 11.5–15.5)
WBC: 7 10*3/uL (ref 4.0–10.5)

## 2020-05-26 LAB — HEPATIC FUNCTION PANEL
ALT: 15 U/L (ref 0–53)
AST: 14 U/L (ref 0–37)
Albumin: 4.1 g/dL (ref 3.5–5.2)
Alkaline Phosphatase: 68 U/L (ref 39–117)
Bilirubin, Direct: 0.1 mg/dL (ref 0.0–0.3)
Total Bilirubin: 0.7 mg/dL (ref 0.2–1.2)
Total Protein: 6.2 g/dL (ref 6.0–8.3)

## 2020-05-26 LAB — HEMOGLOBIN A1C: Hgb A1c MFr Bld: 5.6 % (ref 4.6–6.5)

## 2020-05-29 ENCOUNTER — Encounter: Payer: BC Managed Care – PPO | Admitting: Family Medicine

## 2020-05-29 LAB — PSA, TOTAL WITH REFLEX TO PSA, FREE: PSA, Total: 0.6 ng/mL (ref <=4.0)

## 2020-06-01 ENCOUNTER — Encounter: Payer: BC Managed Care – PPO | Admitting: Family Medicine

## 2020-06-19 ENCOUNTER — Other Ambulatory Visit: Payer: Self-pay | Admitting: Family Medicine

## 2020-06-20 DIAGNOSIS — Z23 Encounter for immunization: Secondary | ICD-10-CM | POA: Diagnosis not present

## 2020-07-02 NOTE — Progress Notes (Signed)
Gorden Stthomas T. Sherian Valenza, MD, CAQ Sports Medicine  Primary Care and Sports Medicine [facility] at [facility] [address] [place], [address]  Phone: [phone]  FAX: [phone]  Vincent Winters - 49 y.o. male  MRN [id number]  Date of Birth: 05-Jan-1971  Date: 07/03/2020  PCP: Hannah Beat, MD  Referral: Hannah Beat, MD  Chief Complaint  Patient presents with  . Annual Exam    This visit occurred during the SARS-CoV-2 public health emergency.  Safety protocols were in place, including screening questions prior to the visit, additional usage of staff PPE, and extensive cleaning of exam room while observing appropriate contact time as indicated for disinfecting solutions.   Patient Care Team: Hannah Beat, MD as PCP - General Subjective:   Vincent Winters is a 49 y.o. pleasant patient who presents with the following:  Preventative Health Maintenance Visit:  Health Maintenance Summary Reviewed and updated, unless pt declines services.  Tobacco History Reviewed. Alcohol: No concerns, no excessive use Exercise Habits: minimal right now STD concerns: no risk or activity to increase risk Drug Use: None  Body mass index is 30.24 kg/m.   Colon cancer screening Review all labs  L shoulder pain:   L side of body is numb all of the time - this basically only in the morning and it resolves after a few minutes.  Sharp pain in and about the shoulder.   Arm and leg on the l will have some tingling - will wake up in the morning and he will have tingling.   Pain off and on on his anterior chest and it will be off and on.  Multiple work-ups by Cardiology and they are negative  He has pain in his anterior chest wall and rarely in the shoulder blade, as well.  Health Maintenance  Topic Date Due  . Hepatitis C Screening  Never done  . HIV Screening  Never done  . COLONOSCOPY  Never done  . TETANUS/TDAP  08/28/2026  .  INFLUENZA VACCINE  Completed  . COVID-19 Vaccine  Completed   Immunization History  Administered Date(s) Administered  . Influenza Inj Mdck Quad With Preservative 08/17/2018  . Influenza,inj,Quad PF,6+ Mos 06/10/2016, 05/22/2019  . Influenza-Unspecified 05/27/2015, 07/26/2017, 06/20/2020  . Moderna SARS-COVID-2 Vaccination 11/18/2019, 12/22/2019  . Tdap 08/28/2016   Patient Active Problem List   Diagnosis Date Noted  . 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 Hypertension 09/07/2009  . Asthma 09/23/2008  . GERD 09/23/2008  . HIATAL HERNIA 09/23/2008    Past Medical History:  Diagnosis Date  . Asthma   . GERD (gastroesophageal reflux disease)   . Hiatal hernia   . Hypertension 09/07/2009   Qualifier: Diagnosis of  By: Patsy Lager MD, Karleen Hampshire      Past Surgical History:  Procedure Laterality Date  . APPENDECTOMY      Family History  Problem Relation Age of Onset  . Hypertension Father     Past Medical History, Surgical History, Social History, Family History, Problem List, Medications, and Allergies have been reviewed and updated if relevant.  Review of Systems: Pertinent positives are listed above.  Otherwise, a full 14 point review of systems has been done in full and it is negative except where it is noted positive.  Objective:   BP 130/90   Pulse 81   Temp 98.5 F (36.9 C) (Temporal)   Ht 5' 7.5" (1.715 m)   Wt 196 lb (88.9 kg)   SpO2 97%  BMI 30.24 kg/m  Ideal Body Weight: Weight in (lb) to have BMI = 25: 161.7  Ideal Body Weight: Weight in (lb) to have BMI = 25: 161.7 No exam data present Depression screen [facility] 2/9 07/03/2020  Decreased Interest 0  Down, Depressed, Hopeless 0  PHQ - 2 Score 0     GEN: well developed, well nourished, no acute distress Eyes: conjunctiva and lids normal, PERRLA, EOMI ENT: TM clear, nares clear, oral exam WNL Neck: supple, no lymphadenopathy, no thyromegaly, no JVD Pulm: clear to auscultation and percussion,  respiratory effort normal CV: regular rate and rhythm, S1-S2, no murmur, rub or gallop, no bruits, peripheral pulses normal and symmetric, no cyanosis, clubbing, edema or varicosities GI: soft, non-tender; no hepatosplenomegaly, masses; active bowel sounds all quadrants GU: deferred Lymph: no cervical, axillary or inguinal adenopathy MSK: gait normal, muscle tone and strength WNL, no joint swelling, effusions, discoloration, crepitus    Shoulder: L Inspection: No muscle wasting or winging Ecchymosis/edema: neg  AC joint, scapula, clavicle: NT Cervical spine: NT, full ROM Spurling's: neg Abduction: full, 5/5 Flexion: full, 5/5 IR, full, lift-off: 5/5 ER at neutral: full, 5/5 AC crossover and compression: neg Neer: neg Hawkins: neg Drop Test: neg Empty Can: neg Supraspinatus insertion: NT Bicipital groove: NT Speed's: neg Yergason's: neg Sulcus sign: neg Scapular dyskinesis: none C5-T1 intact Sensation intact Grip 5/5   SKIN: clear, good turgor, color WNL, no rashes, lesions, or ulcerations Neuro: normal mental status, normal strength, sensation, and motion Psych: alert; oriented to person, place and time, normally interactive and not anxious or depressed in appearance.  All labs reviewed with patient. Results for orders placed or performed in visit on 05/26/20  PSA, Total with Reflex to PSA, Free  Result Value Ref Range   PSA, Total 0.6 < OR = 4.0 ng/mL  Hemoglobin A1c  Result Value Ref Range   Hgb A1c MFr Bld 5.6 4.6 - 6.5 %  Basic metabolic panel  Result Value Ref Range   Sodium 137 135 - 145 mEq/L   Potassium 4.4 3.5 - 5.1 mEq/L   Chloride 102 96 - 112 mEq/L   CO2 30 19 - 32 mEq/L   Glucose, Bld 101 (H) 70 - 99 mg/dL   BUN 17 6 - 23 mg/dL   Creatinine, Ser 8.11 0.40 - 1.50 mg/dL   GFR 91.47 (L) >82.95 mL/min   Calcium 9.0 8.4 - 10.5 mg/dL  Hepatic function panel  Result Value Ref Range   Total Bilirubin 0.7 0.2 - 1.2 mg/dL   Bilirubin, Direct 0.1 0.0 - 0.3  mg/dL   Alkaline Phosphatase 68 39 - 117 U/L   AST 14 0 - 37 U/L   ALT 15 0 - 53 U/L   Total Protein 6.2 6.0 - 8.3 g/dL   Albumin 4.1 3.5 - 5.2 g/dL  CBC with Differential/Platelet  Result Value Ref Range   WBC 7.0 4.0 - 10.5 K/uL   RBC 5.14 4.22 - 5.81 Mil/uL   Hemoglobin 16.0 13.0 - 17.0 g/dL   HCT 62.1 39 - 52 %   MCV 90.0 78.0 - 100.0 fl   MCHC 34.6 30.0 - 36.0 g/dL   RDW 30.8 65.7 - 84.6 %   Platelets 231.0 150 - 400 K/uL   Neutrophils Relative % 61.8 43 - 77 %   Lymphocytes Relative 24.3 12 - 46 %   Monocytes Relative 9.2 3 - 12 %   Eosinophils Relative 4.1 0 - 5 %   Basophils Relative 0.6 0 -  3 %   Neutro Abs 4.3 1.4 - 7.7 K/uL   Lymphs Abs 1.7 0.7 - 4.0 K/uL   Monocytes Absolute 0.6 0.1 - 1.0 K/uL   Eosinophils Absolute 0.3 0.0 - 0.7 K/uL   Basophils Absolute 0.0 0.0 - 0.1 K/uL  Lipid panel  Result Value Ref Range   Cholesterol 172 0 - 200 mg/dL   Triglycerides 69.6 0 - 149 mg/dL   HDL 78.93 >81.01 mg/dL   VLDL 75.1 0.0 - 02.5 mg/dL   LDL Cholesterol 852 (H) 0 - 99 mg/dL   Total CHOL/HDL Ratio 4    NonHDL 132.56     Assessment and Plan:     ICD-10-CM   1. Healthcare maintenance  Z00.00   2. Screen for colon cancer  Z12.11 Ambulatory referral to Gastroenterology  3. Left-sided chest wall pain  R07.89 Ambulatory referral to Physical Therapy   Globally, he needs to work on his weight and fitness.  Total encounter time: 10 minutes. This includes total time spent on the day of encounter. We reviewed in detail his chest wall pain.  He has had this for years without definitive diagnosis.  It is not coming from his shoulder.  His exam is very reassuring.  I think that having a PT evaluation may be of benefit for therapy, but also to have a different MSK exam.  Health Maintenance Exam: The patient's preventative maintenance and recommended screening tests for an annual wellness exam were reviewed in full today. Brought up to date unless services declined.  Counselled  on the importance of diet, exercise, and its role in overall health and mortality. The patient's FH and SH was reviewed, including their home life, tobacco status, and drug and alcohol status.  Follow-up in 1 year for physical exam or additional follow-up below.  Follow-up: No follow-ups on file. Or follow-up in 1 year if not noted.  No orders of the defined types were placed in this encounter.  Medications Discontinued During This Encounter  Medication Reason  . FLOVENT HFA 110 MCG/ACT inhaler Duplicate   Orders Placed This Encounter  Procedures  . Ambulatory referral to Gastroenterology  . Ambulatory referral to Physical Therapy    Signed,  Karleen Hampshire T. Mj Willis, MD   Allergies as of 07/03/2020   No Known Allergies     Medication List       Accurate as of July 03, 2020 11:59 PM. If you have any questions, ask your nurse or doctor.        albuterol 108 (90 Base) MCG/ACT inhaler Commonly known as: VENTOLIN HFA INHALE 2 PUFFS INTO THE LUNGS EVERY 6 HOURS AS NEEDED   amLODipine 5 MG tablet Commonly known as: NORVASC TAKE 1 TABLET DAILY   aspirin EC 81 MG tablet Take 81 mg by mouth daily.   fluticasone 110 MCG/ACT inhaler Commonly known as: FLOVENT HFA Inhale 1 puff into the lungs 2 (two) times daily as needed.   triamcinolone cream 0.1 % Commonly known as: KENALOG Apply 1 application topically 2 (two) times daily.

## 2020-07-03 ENCOUNTER — Other Ambulatory Visit: Payer: Self-pay

## 2020-07-03 ENCOUNTER — Encounter: Payer: Self-pay | Admitting: Family Medicine

## 2020-07-03 ENCOUNTER — Ambulatory Visit: Payer: BC Managed Care – PPO | Admitting: Family Medicine

## 2020-07-03 VITALS — BP 130/90 | HR 81 | Temp 98.5°F | Ht 67.5 in | Wt 196.0 lb

## 2020-07-03 DIAGNOSIS — Z1211 Encounter for screening for malignant neoplasm of colon: Secondary | ICD-10-CM

## 2020-07-03 DIAGNOSIS — R0789 Other chest pain: Secondary | ICD-10-CM | POA: Diagnosis not present

## 2020-07-03 DIAGNOSIS — Z0000 Encounter for general adult medical examination without abnormal findings: Secondary | ICD-10-CM | POA: Diagnosis not present

## 2020-07-07 ENCOUNTER — Encounter: Payer: Self-pay | Admitting: *Deleted

## 2020-07-11 DIAGNOSIS — Z23 Encounter for immunization: Secondary | ICD-10-CM | POA: Diagnosis not present

## 2020-09-19 ENCOUNTER — Other Ambulatory Visit: Payer: Self-pay | Admitting: *Deleted

## 2020-09-19 MED ORDER — AMLODIPINE BESYLATE 5 MG PO TABS
5.0000 mg | ORAL_TABLET | Freq: Every day | ORAL | 3 refills | Status: DC
Start: 2020-09-19 — End: 2021-10-10

## 2020-10-26 ENCOUNTER — Ambulatory Visit: Payer: BC Managed Care – PPO | Admitting: Internal Medicine

## 2020-10-26 ENCOUNTER — Encounter: Payer: Self-pay | Admitting: Internal Medicine

## 2020-10-26 ENCOUNTER — Other Ambulatory Visit: Payer: Self-pay

## 2020-10-26 VITALS — BP 148/92 | HR 82 | Ht 68.0 in | Wt 199.2 lb

## 2020-10-26 DIAGNOSIS — I10 Essential (primary) hypertension: Secondary | ICD-10-CM | POA: Diagnosis not present

## 2020-10-26 DIAGNOSIS — R55 Syncope and collapse: Secondary | ICD-10-CM

## 2020-10-26 DIAGNOSIS — R002 Palpitations: Secondary | ICD-10-CM | POA: Diagnosis not present

## 2020-10-26 NOTE — Patient Instructions (Signed)
Medication Instructions:  - Your physician recommends that you continue on your current medications as directed. Please refer to the Current Medication list given to you today.  *If you need a refill on your cardiac medications before your next appointment, please call your pharmacy*   Lab Work: - none ordered  If you have labs (blood work) drawn today and your tests are completely normal, you will receive your results only by: Marland Kitchen MyChart Message (if you have MyChart) OR . A paper copy in the mail If you have any lab test that is abnormal or we need to change your treatment, we will call you to review the results.   Testing/Procedures: - Your physician has recommended that you have a home sleep study. This test records several body functions during sleep, including: brain activity, eye movement, oxygen and carbon dioxide blood levels, heart rate and rhythm, breathing rate and rhythm, the flow of air through your mouth and nose, snoring, body muscle movements, and chest and belly movement.  - we are expecting new equipment for home sleep test to arrive anytime.  - we will notify your insurance when this is available to make sure they will approve this and then get you set up to have this done    Follow-Up: At [facility], you and your health needs are our priority.  As part of our continuing mission to provide you with exceptional heart care, we have created designated Provider Care Teams.  These Care Teams include your primary Cardiologist (physician) and Advanced Practice Providers (APPs -  Physician Assistants and Nurse Practitioners) who all work together to provide you with the care you need, when you need it.  We recommend signing up for the patient portal called "MyChart".  Sign up information is provided on this After Visit Summary.  MyChart is used to connect with patients for Virtual Visits (Telemedicine).  Patients are able to view lab/test results, encounter notes, upcoming  appointments, etc.  Non-urgent messages can be sent to your provider as well.   To learn more about what you can do with MyChart, go to [email or web address].    Your next appointment:   1 year(s)  The format for your next appointment:   In Person  Provider:   Sherryl Manges, MD   Other Instructions n/a

## 2020-10-26 NOTE — Progress Notes (Signed)
      Patient Care Team: Hannah Beat, MD as PCP - General   HPI  Vincent Winters is a 50 y.o. male seen in follow-up for recurrent syncope-thought by me to be neurally mediated with pain triggers, residual orthostatic intolerance.  Cardiac evaluation included normal electrocardiogram and when last seen, 2019, normal echocardiogram  No interval syncope  Sleep diosordered breathing   Records and Results Reviewed   Past Medical History:  Diagnosis Date  . Asthma   . GERD (gastroesophageal reflux disease)   . Hiatal hernia   . Hypertension 09/07/2009   Qualifier: Diagnosis of  By: Patsy Lager MD, Karleen Hampshire      Past Surgical History:  Procedure Laterality Date  . APPENDECTOMY      Current Meds  Medication Sig  . albuterol (PROVENTIL HFA;VENTOLIN HFA) 108 (90 Base) MCG/ACT inhaler INHALE 2 PUFFS INTO THE LUNGS EVERY 6 HOURS AS NEEDED  . amLODipine (NORVASC) 5 MG tablet Take 1 tablet (5 mg total) by mouth daily.  Marland Kitchen aspirin EC 81 MG tablet Take 81 mg by mouth daily.  . fluticasone (FLOVENT HFA) 110 MCG/ACT inhaler Inhale 1 puff into the lungs 2 (two) times daily as needed.  . triamcinolone cream (KENALOG) 0.1 % Apply 1 application topically 2 (two) times daily.    No Known Allergies    Review of Systems negative except from HPI and PMH  Physical Exam BP (!) 148/92 (BP Location: Left Arm, Patient Position: Sitting, Cuff Size: Normal)   Pulse 82   Ht 5\' 8"  (1.727 m)   Wt 199 lb 4 oz (90.4 kg)   SpO2 98%   BMI 30.30 kg/m  Well developed and nourished in no acute distress HENT normal Neck supple with JVP  Clear Regular rate and rhythm, no murmurs or gallops Abd-soft with active BS No Clubbing cyanosis edema Skin-warm and dry A & Oriented  Grossly normal sensory and motor function     Assessment and  Plan  Syncope  Hypertension  Palpitations  Sleep disordered breathing  Discussed weight loss and the control of BP and relationship to sleep  disordered breathing  No interval syncope   Current medicines are reviewed at length with the patient today .  The patient does not * have concerns regarding medicines.

## 2020-11-11 ENCOUNTER — Encounter: Payer: Self-pay | Admitting: Family Medicine

## 2020-11-11 NOTE — Progress Notes (Signed)
Vincent Fatima T. Saki Legore, MD, CAQ Sports Medicine  Primary Care and Sports Medicine [facility] [address] [place], [address]  Phone: [phone]  FAX: [phone]  Vincent Winters - 50 y.o. male  MRN [id number]  Date of Birth: 1971-06-21  Date: 11/13/2020  PCP: Hannah Beat, MD  Referral: Hannah Beat, MD  Chief Complaint  Patient presents with  . Hypertension    This visit occurred during the SARS-CoV-2 public health emergency.  Safety protocols were in place, including screening questions prior to the visit, additional usage of staff PPE, and extensive cleaning of exam room while observing appropriate contact time as indicated for disinfecting solutions.   Subjective:   Vincent Winters is a 50 y.o. very pleasant male patient with Body mass index is 30.82 kg/m. who presents with the following:  He is here for blood pressure follow-up appointment.  He saw Dr. Graciela Husbands October 26, 2020.  HTN: Tolerating all medications without side effects His blood pressure has increased some recently He is having some pressure-like feelings in his head and back of his head.  He associates this with some increased blood pressure.  He also is having some pressure in the ears as well associated with this.  BP Readings from Last 3 Encounters:  11/13/20 (!) 141/100  10/26/20 (!) 148/92  07/03/20 130/90   150's to 160's at home On the couch, high 130's Diastolic, 90-100.  Norvasc 5 mg only.   142/98 on my recheck  Basic Metabolic Panel:    Component Value Date/Time   NA 137 05/26/2020 0811   NA 136 04/29/2014 1747   K 4.4 05/26/2020 0811   K 3.8 04/29/2014 1747   CL 102 05/26/2020 0811   CL 104 04/29/2014 1747   CO2 30 05/26/2020 0811   CO2 25 04/29/2014 1747   BUN 17 05/26/2020 0811   BUN 11 04/29/2014 1747   CREATININE 1.33 05/26/2020 0811   CREATININE 1.37 (H) 04/29/2014 1747   GLUCOSE 101 (H) 05/26/2020 0811    GLUCOSE 87 04/29/2014 1747   CALCIUM 9.0 05/26/2020 0811   CALCIUM 8.5 04/29/2014 1747     Review of Systems is noted in the HPI, as appropriate  Objective:   BP (!) 141/100   Pulse 89   Temp 98.5 F (36.9 C) (Temporal)   Ht 5' 7.5" (1.715 m)   Wt 199 lb 12 oz (90.6 kg)   SpO2 96%   BMI 30.82 kg/m   GEN: No acute distress; alert,appropriate. PULM: Breathing comfortably in no respiratory distress PSYCH: Normally interactive.  CV: RRR, no m/g/r   Laboratory and Imaging Data:  Assessment and Plan:     ICD-10-CM   1. Hypertension, essential  I10    Uncontrolled.  Add HCTZ to Norvasc 5. He previously did take Norvasc 10 mg, but he did not able to tolerate this secondary to side effects.  Meds ordered this encounter  Medications  . hydrochlorothiazide (HYDRODIURIL) 12.5 MG tablet    Sig: Take 1 tablet (12.5 mg total) by mouth daily.    Dispense:  30 tablet    Refill:  3   Patient Instructions  Send me a Mychart message in a month.  Check your BP every 2 days and write down.      Signed,  Elpidio Galea. Fahd Galea, MD   Outpatient Encounter Medications as of 11/13/2020  Medication Sig  . albuterol (PROVENTIL HFA;VENTOLIN HFA) 108 (90 Base) MCG/ACT inhaler INHALE 2 PUFFS INTO  THE LUNGS EVERY 6 HOURS AS NEEDED  . amLODipine (NORVASC) 5 MG tablet Take 1 tablet (5 mg total) by mouth daily.  Marland Kitchen aspirin EC 81 MG tablet Take 81 mg by mouth daily.  . fluticasone (FLOVENT HFA) 110 MCG/ACT inhaler Inhale 1 puff into the lungs 2 (two) times daily as needed.  . hydrochlorothiazide (HYDRODIURIL) 12.5 MG tablet Take 1 tablet (12.5 mg total) by mouth daily.  Marland Kitchen triamcinolone cream (KENALOG) 0.1 % Apply 1 application topically 2 (two) times daily.   No facility-administered encounter medications on file as of 11/13/2020.

## 2020-11-13 ENCOUNTER — Ambulatory Visit: Payer: BC Managed Care – PPO | Admitting: Family Medicine

## 2020-11-13 ENCOUNTER — Other Ambulatory Visit: Payer: Self-pay

## 2020-11-13 ENCOUNTER — Encounter: Payer: Self-pay | Admitting: Family Medicine

## 2020-11-13 VITALS — BP 141/100 | HR 89 | Temp 98.5°F | Ht 67.5 in | Wt 199.8 lb

## 2020-11-13 DIAGNOSIS — I10 Essential (primary) hypertension: Secondary | ICD-10-CM | POA: Diagnosis not present

## 2020-11-13 MED ORDER — HYDROCHLOROTHIAZIDE 12.5 MG PO TABS
12.5000 mg | ORAL_TABLET | Freq: Every day | ORAL | 3 refills | Status: DC
Start: 2020-11-13 — End: 2022-03-21

## 2020-11-13 NOTE — Patient Instructions (Signed)
Send me a Mychart message in a month.  Check your BP every 2 days and write down.

## 2020-12-12 ENCOUNTER — Telehealth: Payer: Self-pay | Admitting: *Deleted

## 2020-12-12 NOTE — Telephone Encounter (Signed)
Staff message sent to Danielle Rankin ok to give patient Watchpat device. Per [place] @ [address] [employer] no PA is required. Call reference # [id number]

## 2020-12-13 ENCOUNTER — Telehealth: Payer: Self-pay | Admitting: *Deleted

## 2020-12-13 NOTE — Telephone Encounter (Signed)
-----   Message from Tarri Fuller, CMA sent at 12/12/2020  3:56 PM EDT ----- Regarding: WATCHPAT SLEEP STUDY Hi everyone,  So not sure who to send this too, so Martie Lee said to send to triage since Herbert Seta is off. Pt see's Dr. Graciela Husbands in the [place] office and the pt lives in [place]. Makes more sense for the pt to get Watchpat from [place] office. If he wants to come to [facility] office then I will be happy to get him set up, but felt more appropriate for [place] office. Does not look like the pt has been set up yet, so he will need to come in to the office.   Thank you  Okey Regal      ----- Message ----- From: Gaynelle Cage, CMA Sent: 12/12/2020   3:18 PM EDT To: Tarri Fuller, CMA  Ok to give patient watchpat. Per his insurance no PA is required. ----- Message ----- From: Florina Ou Sent: 12/07/2020   4:36 PM EDT To: [facility] Sleep [facility]   ----- Message ----- From: Jefferey Pica, RN Sent: 12/07/2020   4:25 PM EDT To: Jefferey Pica, RN, #  Dr. Graciela Husbands had seen this patient a few weeks ago (prior to the go live of the [facility] home sleep test). He was wanting this patient to have this done, so I just wanted to check to see if he will need authorization for this.   Dx- sleep disordered breathing.   Thank you!

## 2020-12-31 MED ORDER — LISINOPRIL-HYDROCHLOROTHIAZIDE 10-12.5 MG PO TABS: 1.0000 | ORAL_TABLET | Freq: Every day | ORAL | 1 refills | Status: DC

## 2021-01-25 NOTE — Telephone Encounter (Signed)
Discussed further with [place]- office manager.  When the patient was last seen in the office, the WatchPat devices were not available. They have since become available, but the patient will need to have a visit to complete his stop bang for the WatchPat.   It would be best to have him scheduled to see Dr. Graciela Husbands to cover the stopbang and appropriate documentation by Dr. Graciela Husbands.  Will forward to scheduling- I think he would be ok for a virtual visit to update everything and then he could come by the office to pick up the [facility] after if he is still agreeable.

## 2021-01-26 NOTE — Telephone Encounter (Signed)
Left VM to schedule appt  

## 2021-02-14 ENCOUNTER — Other Ambulatory Visit: Payer: Self-pay

## 2021-02-14 DIAGNOSIS — Z1211 Encounter for screening for malignant neoplasm of colon: Secondary | ICD-10-CM

## 2021-02-14 MED ORDER — CLENPIQ 10-3.5-12 MG-GM -GM/160ML PO SOLN: 320.0000 mL | Freq: Once | ORAL | 0 refills | Status: AC

## 2021-02-15 ENCOUNTER — Encounter: Payer: Self-pay | Admitting: *Deleted

## 2021-04-10 ENCOUNTER — Other Ambulatory Visit: Payer: Self-pay

## 2021-04-10 ENCOUNTER — Ambulatory Visit: Payer: BC Managed Care – PPO | Admitting: Internal Medicine

## 2021-04-10 ENCOUNTER — Encounter: Payer: Self-pay | Admitting: Internal Medicine

## 2021-04-10 VITALS — BP 134/90 | HR 94 | Ht 67.5 in | Wt 199.0 lb

## 2021-04-10 DIAGNOSIS — Z79899 Other long term (current) drug therapy: Secondary | ICD-10-CM | POA: Diagnosis not present

## 2021-04-10 DIAGNOSIS — I10 Essential (primary) hypertension: Secondary | ICD-10-CM

## 2021-04-10 DIAGNOSIS — R002 Palpitations: Secondary | ICD-10-CM | POA: Diagnosis not present

## 2021-04-10 DIAGNOSIS — R55 Syncope and collapse: Secondary | ICD-10-CM | POA: Diagnosis not present

## 2021-04-10 DIAGNOSIS — G4730 Sleep apnea, unspecified: Secondary | ICD-10-CM

## 2021-04-10 MED ORDER — LISINOPRIL 5 MG PO TABS: 5.0000 mg | ORAL_TABLET | Freq: Every day | ORAL | 3 refills | Status: DC

## 2021-04-10 NOTE — Progress Notes (Signed)
      Patient Care Team: Hannah Beat, MD as PCP - General   HPI  Vincent Winters is a 50 y.o. male Seen in followup for sleep disordered breathing and in need of a sleep study.  Also has treated hypertension and more recently with the introduction of his lisinopril hydrochlorothiazide combination in addition to his amlodipine he has had significant lightheadedness and hypotension.  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   fluticasone (FLOVENT HFA) 110 MCG/ACT inhaler Inhale 1 puff into the lungs 2 (two) times daily as needed.   lisinopril-hydrochlorothiazide (ZESTORETIC) 10-12.5 MG tablet Take 1 tablet by mouth daily.   triamcinolone cream (KENALOG) 0.1 % Apply 1 application topically 2 (two) times daily.    No Known Allergies    Review of Systems negative except from HPI and PMH  Physical Exam BP 134/90 (BP Location: Left Arm, Patient Position: Sitting, Cuff Size: Normal)   Pulse 94   Ht 5' 7.5" (1.715 m)   Wt 199 lb (90.3 kg)   SpO2 98%   BMI 30.71 kg/m  Well developed and well nourished in no acute distress HENT normal E scleral and icterus clear Neck Supple JVP flat; carotids brisk and full Clear to ausculation  Regular rate and rhythm, no murmurs gallops or rub Soft with active bowel  sounds No clubbing cyanosis  Edema Alert and oriented, grossly normal motor and sensory function Skin Warm and Dry  ECG    CrCl cannot be calculated (Patient's most recent lab result is older than the maximum 21 days allowed.).   Assessment and  Plan Syncope   Hypertension   Palpitations   Sleep disordered breathing  Stop bang score was 6.  We will undertake sleep study.  With a history of symptomatic hypotension, will discontinue his lisinopril hydrochlorothiazide combination and treat him with lisinopril 5 mg a day initially with guidance to increase it to 10 mg a day if his blood pressure does not achieve target of 130 systolic.  Palpitations quiescent      Current medicines are reviewed at length with the patient today .  The patient does not  have concerns regarding medicines.

## 2021-04-10 NOTE — Patient Instructions (Signed)
Medication Instructions:  - Your physician has recommended you make the following change in your medication:   1) STOP lisinopril-hctz  2) START lisinopril 5 mg- take 1 tablet by mouth TWICE daily  *If you need a refill on your cardiac medications before your next appointment, please call your pharmacy*   Lab Work: - Your physician recommends that you return for lab work in: 2 weeks- BMP  Medical Mall Entrance at [facility] 1st desk on the right to check in, past the screening table Lab hours: Monday- Friday (7:30 am- 5:30 pm)   If you have labs (blood work) drawn today and your tests are completely normal, you will receive your results only by: MyChart Message (if you have MyChart) OR A paper copy in the mail If you have any lab test that is abnormal or we need to change your treatment, we will call you to review the results.   Testing/Procedures: 1) WatchPAT Home Sleep:   - we will send this to your insurance - once authorization is obtained, we will call you with the PIN # to activate the home study - DO NOT open the box/ break the seal on the box until we call you with the PIN #   Follow-Up: At [facility], you and your health needs are our priority.  As part of our continuing mission to provide you with exceptional heart care, we have created designated Provider Care Teams.  These Care Teams include your primary Cardiologist (physician) and Advanced Practice Providers (APPs -  Physician Assistants and Nurse Practitioners) who all work together to provide you with the care you need, when you need it.  We recommend signing up for the patient portal called "MyChart".  Sign up information is provided on this After Visit Summary.  MyChart is used to connect with patients for Virtual Visits (Telemedicine).  Patients are able to view lab/test results, encounter notes, upcoming appointments, etc.  Non-urgent messages can be sent to your provider as well.   To learn more about what you  can do with MyChart, go to [email or web address].    Your next appointment:   February 2023   The format for your next appointment:   In Person  Provider:   Sherryl Manges, MD   Other Instructions N/a

## 2021-05-03 ENCOUNTER — Other Ambulatory Visit
Admission: RE | Admit: 2021-05-03 | Discharge: 2021-05-03 | Disposition: A | Discharge status: Home / Self Care | Payer: BC Managed Care – PPO | Attending: Internal Medicine | Admitting: Internal Medicine

## 2021-05-03 DIAGNOSIS — I10 Essential (primary) hypertension: Secondary | ICD-10-CM | POA: Diagnosis not present

## 2021-05-03 DIAGNOSIS — Z79899 Other long term (current) drug therapy: Secondary | ICD-10-CM | POA: Diagnosis not present

## 2021-05-03 DIAGNOSIS — R002 Palpitations: Secondary | ICD-10-CM | POA: Insufficient documentation

## 2021-05-03 LAB — BASIC METABOLIC PANEL
Anion gap: 7 (ref 5–15)
BUN: 19 mg/dL (ref 6–20)
CO2: 26 mmol/L (ref 22–32)
Calcium: 8.8 mg/dL — ABNORMAL LOW (ref 8.9–10.3)
Chloride: 101 mmol/L (ref 98–111)
Creatinine, Ser: 1.31 mg/dL — ABNORMAL HIGH (ref 0.61–1.24)
GFR, Estimated: >60 mL/min (ref >60)
Glucose, Bld: 96 mg/dL (ref 70–99)
Potassium: 3.9 mmol/L (ref 3.5–5.1)
Sodium: 134 mmol/L — ABNORMAL LOW (ref 135–145)

## 2021-05-09 ENCOUNTER — Encounter: Payer: Self-pay | Admitting: Anesthesiology

## 2021-05-10 ENCOUNTER — Encounter: Payer: Self-pay | Admitting: Gastroenterology

## 2021-05-10 ENCOUNTER — Other Ambulatory Visit: Payer: Self-pay

## 2021-05-15 ENCOUNTER — Encounter (INDEPENDENT_AMBULATORY_CARE_PROVIDER_SITE_OTHER): Payer: BC Managed Care – PPO | Admitting: Cardiology

## 2021-05-15 DIAGNOSIS — G4733 Obstructive sleep apnea (adult) (pediatric): Secondary | ICD-10-CM | POA: Diagnosis not present

## 2021-05-15 NOTE — Anesthesia Preprocedure Evaluation (Deleted)
Anesthesia Evaluation    Airway        Dental   Pulmonary asthma ,  Sleep-disordered breathing          Cardiovascular hypertension,   ECG 10/26/20: normal   Neuro/Psych    GI/Hepatic hiatal hernia, GERD  ,  Endo/Other    Renal/GU      Musculoskeletal   Abdominal   Peds  Hematology   Anesthesia Other Findings Cardiology note 04/10/21:  Assessment and  Plan Syncope  Hypertension  Palpitations  Sleep disordered breathing  Stop bang score was 6.  We will undertake sleep study.  With a history of symptomatic hypotension, will discontinue his lisinopril hydrochlorothiazide combination and treat him with lisinopril 5 mg a day initially with guidance to increase it to 10 mg a day if his blood pressure does not achieve target of 130 systolic.  Palpitations quiescent   Current medicines are reviewed at length with the patient today . The patient does not  haveconcerns regarding medicines.  Reproductive/Obstetrics                            Anesthesia Physical Anesthesia Plan  ASA: 2  Anesthesia Plan: General   Post-op Pain Management:    Induction: Intravenous  PONV Risk Score and Plan: 2 and TIVA, Propofol infusion and Treatment may vary due to age or medical condition  Airway Management Planned: Natural Airway  Additional Equipment:   Intra-op Plan:   Post-operative Plan:   Informed Consent:   Plan Discussed with:   Anesthesia Plan Comments:         Anesthesia Quick Evaluation

## 2021-05-18 ENCOUNTER — Ambulatory Visit
Admission: RE | Admit: 2021-05-18 | Payer: BC Managed Care – PPO | Source: Home / Self Care | Admitting: Gastroenterology

## 2021-05-18 SURGERY — COLONOSCOPY WITH PROPOFOL
Anesthesia: Choice

## 2021-05-20 ENCOUNTER — Ambulatory Visit: Payer: BC Managed Care – PPO

## 2021-05-20 DIAGNOSIS — G4730 Sleep apnea, unspecified: Secondary | ICD-10-CM

## 2021-05-20 NOTE — Procedures (Signed)
   Sleep Study Report  Patient Information Study Date: 05/15/21 Patient Name: Vincent Winters Patient ID: [id number] Birth Date: [birth year] Age: 50 Gender: Male BMI: 31.1 (W=198 lb, H=5' 7'') Referring Physician:Steven Graciela Husbands, MD  TEST DESCRIPTION: Home sleep apnea testing was completed using the WatchPat, a Type 1 device, utilizing peripheral arterial tonometry (PAT), chest movement, actigraphy, pulse oximetry, pulse rate, body position and snore. AHI was calculated with apnea and hypopnea using valid sleep time as the denominator. RDI includes apneas, hypopneas, and RERAs. The data acquired and the scoring of sleep and all associated events were performed in accordance with the recommended standards and specifications as outlined in the AASM Manual for the Scoring of Sleep and Associated Events 2.2.0 (2015).  FINDINGS: 1. Mild Obstructive Sleep Apnea with AHI 5.1/hr. 2. No Central Sleep Apnea with pAHIc 1/hr. 3. Oxygen desaturations as low as 84%. 4. Minimal snoring was present. O2 sats were < 88% for 0 min. 5. Total sleep time was 4 hrs and 56 min. 6. 13.8% of total sleep time was spent in REM sleep. 7. Normal sleep onset latency at 16 min. 8. Normal REM sleep onset latency at 86 min. 9. Total awakenings were 17.  DIAGNOSIS: Mild Obstructive Sleep Apnea (G47.33)  RECOMMENDATIONS: 1. Clinical correlation of these findings is necessary. The decision to treat obstructive sleep apnea (OSA) is usually based on the presence of apnea symptoms or the presence of associated medical conditions such as Hypertension, Congestive Heart Failure, Atrial Fibrillation or Obesity. The most common symptoms of OSA are snoring, gasping for breath while sleeping, daytime sleepiness and fatigue.  2. Initiating apnea therapy is recommended given the presence of symptoms and/or associated conditions. Recommend proceeding with one of the following:   a. Auto-CPAP therapy with a pressure range of  5-20cm H2O.   b. An oral appliance (OA) that can be obtained from certain dentists with expertise in sleep medicine. These are primarily of use in non-obese patients with mild and moderate disease.   c. An ENT consultation which may be useful to look for specific causes of obstruction and possible treatment options.   d. If patient is intolerant to PAP therapy, consider referral to ENT for evaluation for hypoglossal nerve stimulator.  3. Close follow-up is necessary to ensure success with CPAP or oral appliance therapy for maximum benefit .  4. A follow-up oximetry study on CPAP is recommended to assess the adequacy of therapy and determine the need for supplemental oxygen or the potential need for Bi-level therapy. An arterial blood gas to determine the adequacy of baseline ventilation and oxygenation should also be considered.  5. Healthy sleep recommendations include: adequate nightly sleep (normal 7-9 hrs/night), avoidance of caffeine after noon and alcohol near bedtime, and maintaining a sleep environment that is cool, dark and quiet.  6. Weight loss for overweight patients is recommended. Even modest amounts of weight loss can significantly improve the severity of sleep apnea.  7. Snoring recommendations include: weight loss where appropriate, side sleeping, and avoidance of alcohol before bed.  8. Operation of motor vehicle should be avoided when sleepy.  Signature: Electronically Signed: 05/20/21 Armanda Magic, MD; [facility]; Diplomat, American Board of Sleep Medicine Report prepared by: Armanda Magic

## 2021-05-21 ENCOUNTER — Telehealth: Payer: Self-pay | Admitting: *Deleted

## 2021-05-21 DIAGNOSIS — G4733 Obstructive sleep apnea (adult) (pediatric): Secondary | ICD-10-CM

## 2021-05-21 NOTE — Telephone Encounter (Signed)
-----   Message from Quintella Reichert, MD sent at 05/20/2021  7:45 PM EDT ----- Borderline study for OSA - please get an in lab PSG

## 2021-05-21 NOTE — Telephone Encounter (Addendum)
The patient has been notified of the result and verbalized understanding.  Left detailed message on voicemail and informed patient to call back.   Vincent Winters, CMA 05/21/2021 10:35 AM    Will precert

## 2021-06-27 NOTE — Telephone Encounter (Signed)
Prior Authorization for NPSG sent to [facility] via Phone. [id number] # PER ALI  L. No PA required.

## 2021-06-27 NOTE — Telephone Encounter (Signed)
Patient is scheduled for lab study on 08/08/21. Patient understands his sleep study will be done at [facility] sleep lab. Patient understands he will receive a sleep packet in a week or so. Patient understands to call if he does not receive the sleep packet in a timely manner.  Left detailed message on voicemail with date and time of SS and informed patient to call back to confirm or reschedule.

## 2021-07-25 IMAGING — CT CT ANGIO CHEST
2 of 6 series · 18 of 46 positions shown · IV contrast (APPLIED)
Comparison: Chest radiographs obtained earlier today.

CLINICAL DATA: Nonradiating left lateral chest pain since this
morning.

EXAM:
CT ANGIOGRAPHY CHEST WITH CONTRAST
TECHNIQUE: Multidetector CT imaging of the chest was performed using the
standard protocol during bolus administration of intravenous
contrast. Multiplanar CT image reconstructions and MIPs were
obtained to evaluate the vascular anatomy.
CONTRAST:  75mL OMNIPAQUE IOHEXOL 350 MG/ML SOLN

[Series 5: thins · axial · 0.73mm/px · z∈[-640,-383]mm · 15 of 283 slices shown]
[im 13/283  lung]
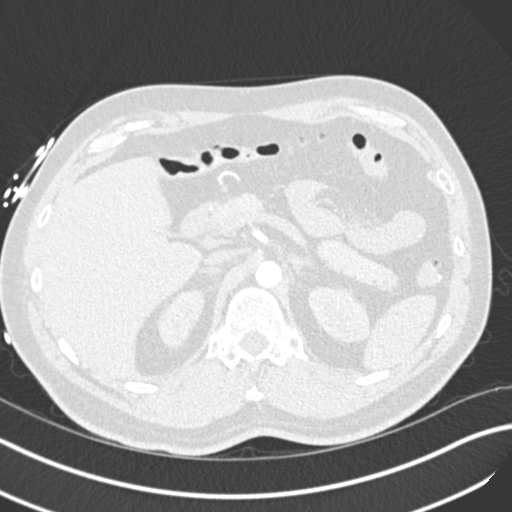
[im 37/283  soft-tissue]
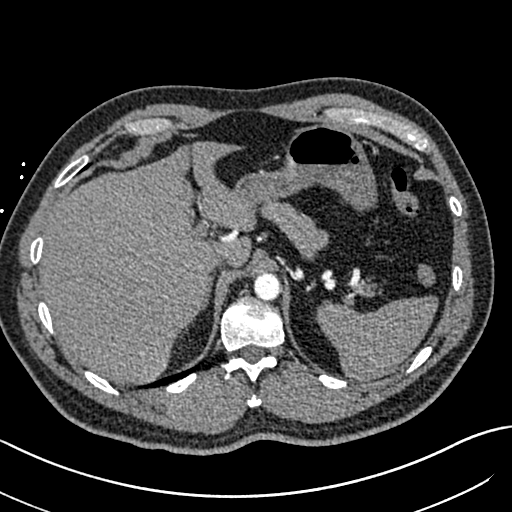
[im 50/283  lung]
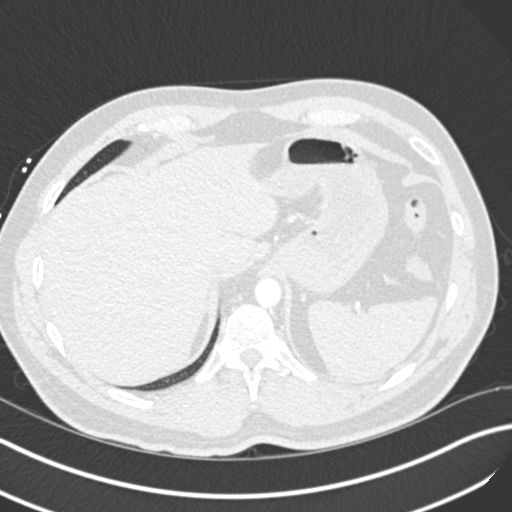
[im 74/283  soft-tissue]
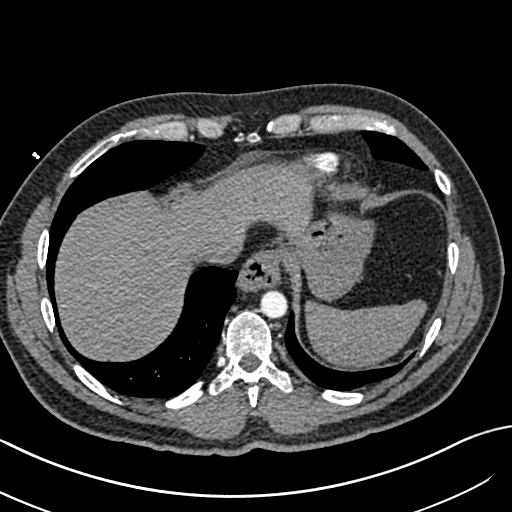
[im 86/283  lung]
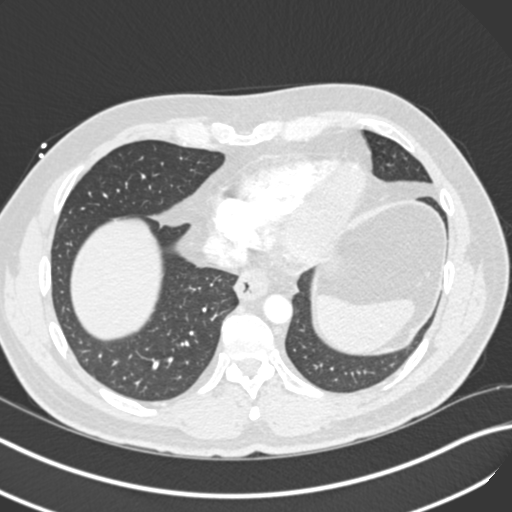
[im 111/283  soft-tissue]
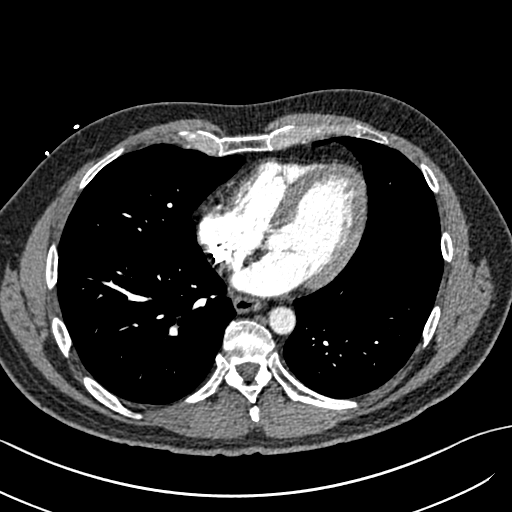
[im 123/283  lung]
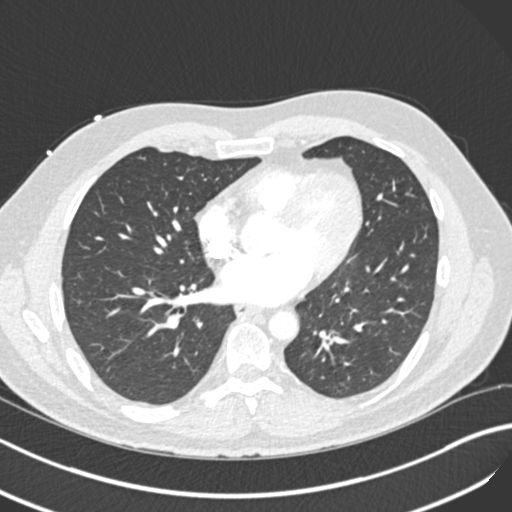
[im 148/283  soft-tissue]
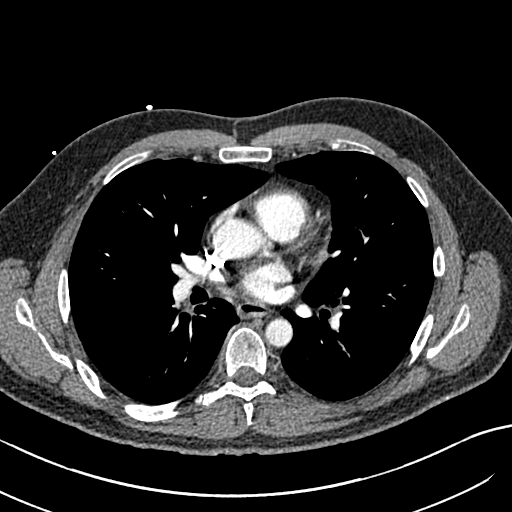
[im 160/283  lung]
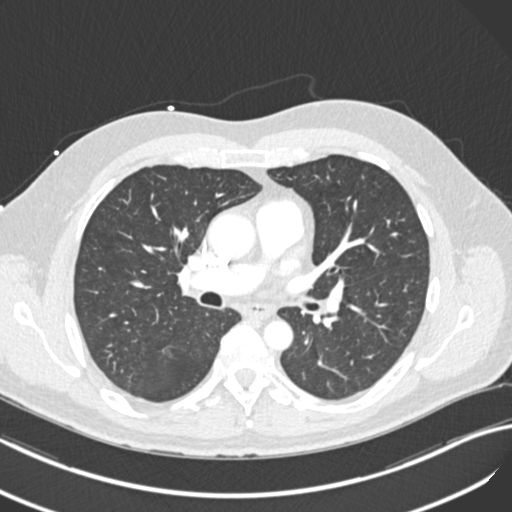
[im 172/283  soft-tissue]
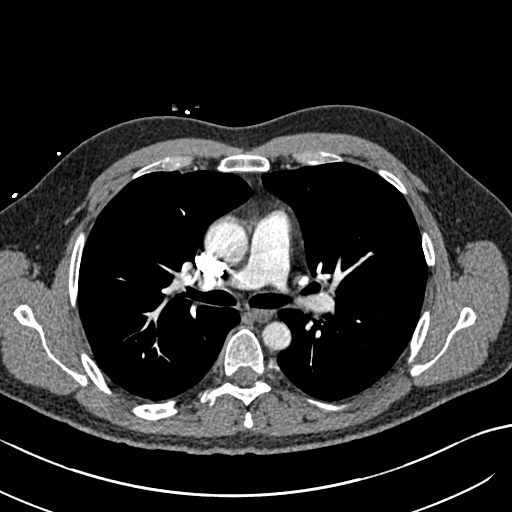
[im 197/283  lung]
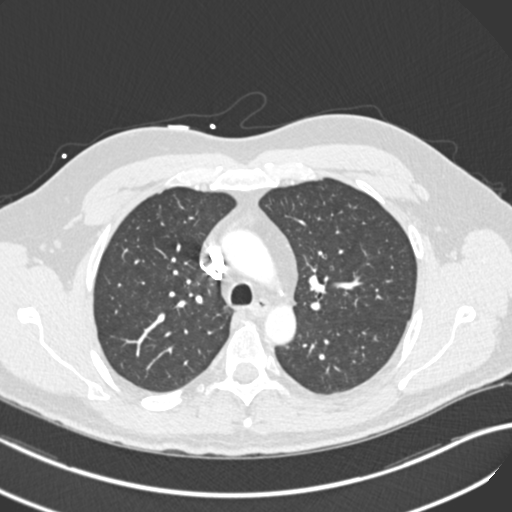
[im 209/283  soft-tissue]
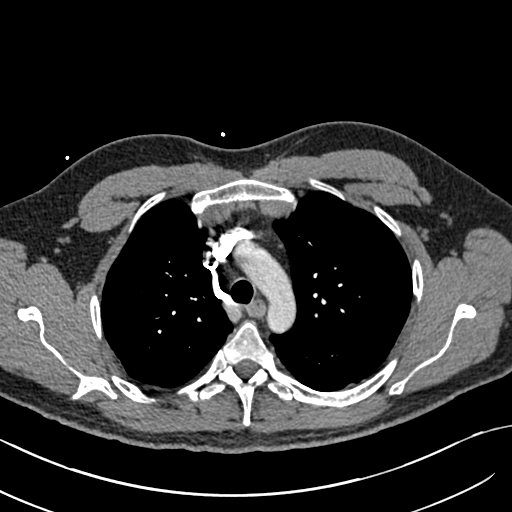
[im 233/283  lung]
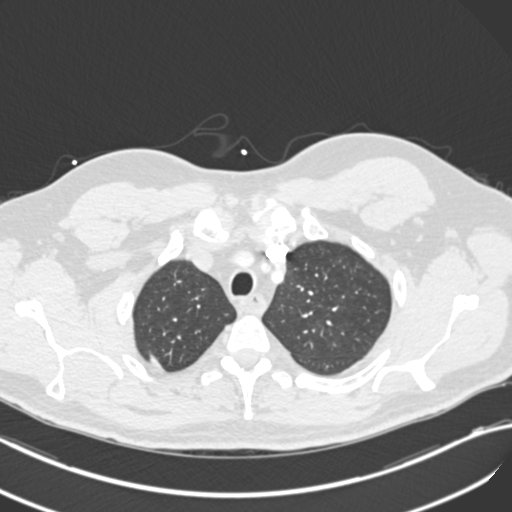
[im 246/283  soft-tissue]
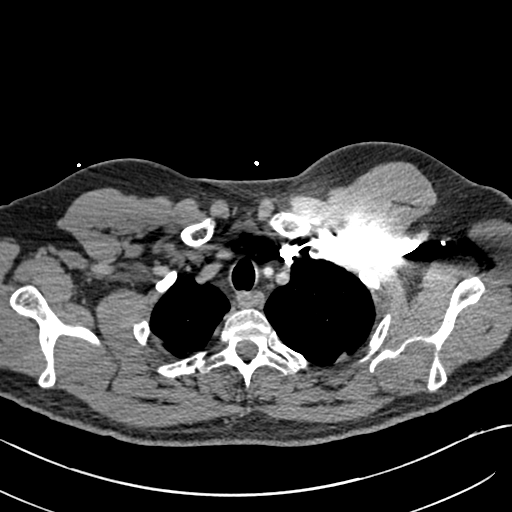
[im 270/283  lung]
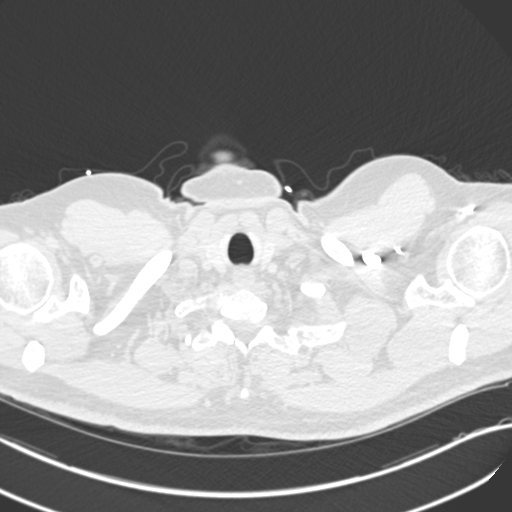

[Series 7: coronal mpr · coronal · 0.55mm/px · 3 of 89 slices shown]
[im 23/89  soft-tissue]
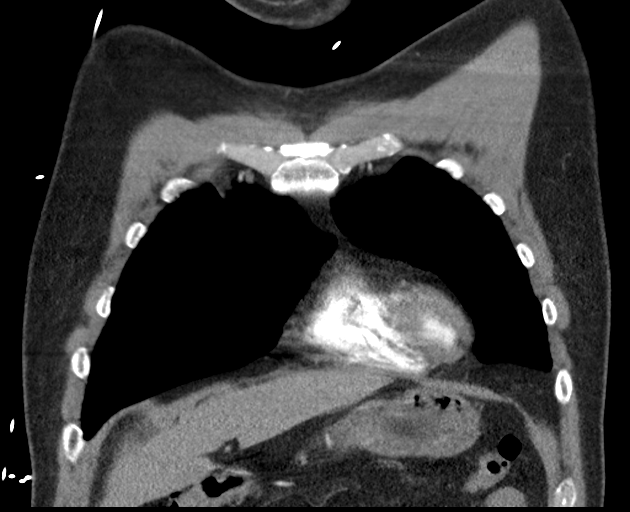
[im 45/89  soft-tissue]
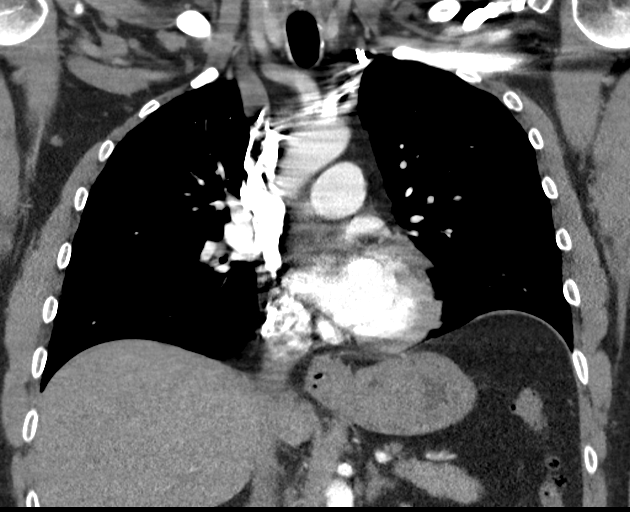
[im 67/89  soft-tissue]
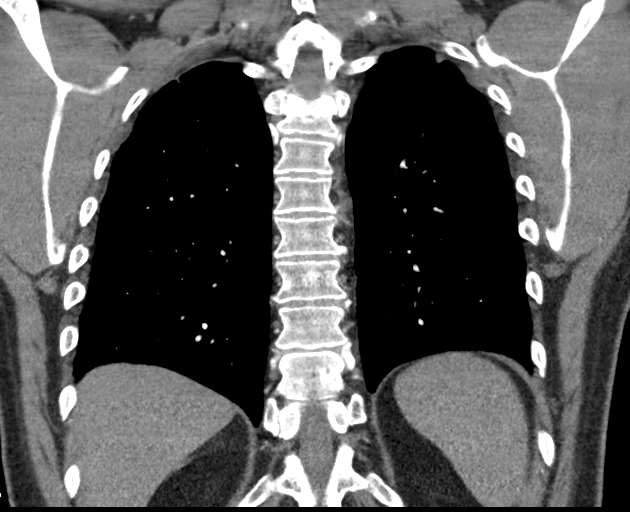

[18 of 46 positions shown; findings below may reference images not displayed]

FINDINGS: Cardiovascular: Satisfactory opacification of the pulmonary arteries
to the segmental level. No evidence of pulmonary embolism. Normal
heart size. No pericardial effusion.

Mediastinum/Nodes: Small hiatal hernia. No enlarged mediastinal,
hilar, or axillary lymph nodes. Thyroid gland, trachea, and
esophagus demonstrate no significant findings.

Lungs/Pleura: 4 mm subpleural nodule in the lateral aspect of the
left lower lobe on image number 57 series 6. Otherwise, clear lungs
with normal vascularity. No pleural fluid or pneumothorax.

Upper Abdomen: Unremarkable.

Musculoskeletal: Mild thoracic spine degenerative changes.

Review of the MIP images confirms the above findings.
IMPRESSION: 1. No pulmonary emboli or acute abnormality.
2. 4 mm subpleural nodule in the left lower lobe. No follow-up
needed if patient is low-risk. Non-contrast chest CT can be
considered in 12 months if patient is high-risk. This recommendation
follows the consensus statement: Guidelines for Management of
Incidental Pulmonary Nodules Detected on CT Images: From the
3. Small hiatal hernia.

## 2021-08-07 ENCOUNTER — Telehealth: Payer: Self-pay

## 2021-08-07 NOTE — Telephone Encounter (Signed)
Letter has been sent to patient instructing them to call us if they are still interested in completing their sleep study. If we have not received a response from the patient within 30 days of this notice, the order will be cancelled and they will need to discuss the need for a sleep study at their next office visit.  ° °

## 2021-08-08 ENCOUNTER — Encounter (HOSPITAL_BASED_OUTPATIENT_CLINIC_OR_DEPARTMENT_OTHER): Payer: BC Managed Care – PPO | Admitting: Cardiology

## 2021-10-08 ENCOUNTER — Other Ambulatory Visit: Payer: Self-pay | Admitting: Family Medicine

## 2021-10-10 NOTE — Telephone Encounter (Signed)
Please cal patient and schedule annual physical.

## 2021-10-12 NOTE — Telephone Encounter (Signed)
Called pt , pt states he will call back after he look at his schedule

## 2021-10-18 ENCOUNTER — Ambulatory Visit: Payer: BC Managed Care – PPO | Admitting: Internal Medicine

## 2021-10-22 DIAGNOSIS — J45901 Unspecified asthma with (acute) exacerbation: Secondary | ICD-10-CM | POA: Diagnosis not present

## 2021-10-22 DIAGNOSIS — R059 Cough, unspecified: Secondary | ICD-10-CM | POA: Diagnosis not present

## 2021-11-08 ENCOUNTER — Ambulatory Visit: Payer: BC Managed Care – PPO | Admitting: Nurse Practitioner

## 2021-11-08 ENCOUNTER — Encounter: Payer: Self-pay | Admitting: Nurse Practitioner

## 2021-11-08 ENCOUNTER — Other Ambulatory Visit: Payer: Self-pay

## 2021-11-08 VITALS — BP 140/78 | HR 87 | Temp 97.6°F | Ht 68.0 in | Wt 194.6 lb

## 2021-11-08 DIAGNOSIS — R051 Acute cough: Secondary | ICD-10-CM | POA: Diagnosis not present

## 2021-11-08 DIAGNOSIS — J4531 Mild persistent asthma with (acute) exacerbation: Secondary | ICD-10-CM

## 2021-11-08 MED ORDER — PREDNISONE 20 MG PO TABS: ORAL_TABLET | ORAL | 0 refills | Status: AC

## 2021-11-08 MED ORDER — TRIAMCINOLONE ACETONIDE 0.1 % EX CREA: 1.0000 "application " | TOPICAL_CREAM | Freq: Two times a day (BID) | CUTANEOUS | 1 refills | Status: AC

## 2021-11-08 NOTE — Patient Instructions (Signed)
Continue using the Flovent inhaler as prescribed. Use the albuterol inhaler as needed ?I sent in some prednisone. If your symptoms do not improve or worsen let us know or follow up. ?

## 2021-11-08 NOTE — Progress Notes (Signed)
Acute 

## 2021-11-08 NOTE — Assessment & Plan Note (Signed)
Patient started back on Flovent inhaler after being seen from urgent care.  Also started on albuterol inhaler with some relief.  Patient's not been covered with azithromycin for infectious purposes.  Will write patient prednisone to see if we can get cough and asthma in remission.  If unable to get in remission will need to step up his ICS therapy or change to ICS/LABA therapy.  Did encourage patient to take antihistamine if he can tolerate it.  Also discussed possibility of adding on Singulair for better control if needed.  Follow-up if no improvement or symptoms worsen. ?

## 2021-11-08 NOTE — Progress Notes (Signed)
? ?Acute Office Visit ? ?Subjective:  ? ? Patient ID: Vincent Winters, male    DOB: 06/26/1971, 51 y.o.   MRN: [id number] ? ?Chief Complaint  ?Patient presents with  ? Acute Visit  ?  Persistent cough X 3 weeks/ sore throat on/off  ? ? ?HPI ?Patient is in today for Cough ? ?States that symptoms started approx 3 weeks ago. States that he went to UC 1.5-2 weeks ago. They did a chest xray. States that the cough started getting better and then came back. Will get post tussive headache. States he started back on the flovent and albuterol. Has not used int he past 2-3 days. States was using it 2-3 times daily. Has taken dayquill. Did try an antihistamine over a week ago ?Covid test approx 3 weeks ago that was negative. States that cough came back a week ago.  No other infective symptoms per patient report ? ?Past Medical History:  ?Diagnosis Date  ? Asthma   ? GERD (gastroesophageal reflux disease)   ? Hiatal hernia   ? Hypertension, essential 09/07/2009  ? Qualifier: Diagnosis of  By: Copland MD, Karleen Hampshire    ? Syncope   ? Presumed neurally mediated (2/19-SK)  ? ? ?Past Surgical History:  ?Procedure Laterality Date  ? APPENDECTOMY    ? ? ?Family History  ?Problem Relation Age of Onset  ? Hypertension Father   ? ? ?Social History  ? ?Socioeconomic History  ? Marital status: Single  ?  Spouse name: Not on file  ? Number of children: Not on file  ? Years of education: Not on file  ? Highest education level: Not on file  ?Occupational History  ? 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  Employer: [employer]  ?Tobacco Use  ? Smoking status: Never  ? Smokeless tobacco: Never  ?Vaping Use  ? Vaping Use: Never used  ?Substance and Sexual Activity  ? Alcohol use: Yes  ? Drug use: No  ? Sexual activity: Not on file  ?Other Topics Concern  ? Not on file  ?Social History Narrative  ? Regular exercise-yes  ?   ? From [place] state  ? ?Social Determinants of Health  ? ?Financial Resource Strain: Not on file  ?Food Insecurity: Not on file   ?Transportation Needs: Not on file  ?Physical Activity: Not on file  ?Stress: Not on file  ?Social Connections: Not on file  ?Intimate Partner Violence: Not on file  ? ? ?Outpatient Medications Prior to Visit  ?Medication Sig Dispense Refill  ? albuterol (PROVENTIL HFA;VENTOLIN HFA) 108 (90 Base) MCG/ACT inhaler INHALE 2 PUFFS INTO THE LUNGS EVERY 6 HOURS AS NEEDED 25.5 g 1  ? amLODipine (NORVASC) 5 MG tablet TAKE 1 TABLET DAILY 90 tablet 0  ? aspirin EC 81 MG tablet Take 81 mg by mouth daily.    ? fluticasone (FLOVENT HFA) 110 MCG/ACT inhaler Inhale 1 puff into the lungs 2 (two) times daily as needed.    ? hydrochlorothiazide (HYDRODIURIL) 12.5 MG tablet Take 1 tablet (12.5 mg total) by mouth daily. 30 tablet 3  ? triamcinolone cream (KENALOG) 0.1 % Apply 1 application topically 2 (two) times daily. 454 g 1  ? azithromycin (ZITHROMAX) 250 MG tablet Take by mouth.    ? benzonatate (TESSALON) 200 MG capsule Take by mouth.    ? lisinopril (ZESTRIL) 5 MG tablet Take 1 tablet (5 mg total) by mouth daily. 90 tablet 3  ? ?No facility-administered medications prior to visit.  ? ? ?No Known  Allergies ? ?Review of Systems  ?Constitutional:  Negative for chills and fever.  ?HENT:  Positive for postnasal drip and sore throat. Negative for congestion, ear discharge and ear pain.   ?Respiratory:  Positive for cough (sometimes clear phelgm). Negative for shortness of breath.   ?Cardiovascular:  Negative for chest pain.  ?Gastrointestinal:  Negative for diarrhea, nausea and vomiting.  ? ?   ?Objective:  ?  ?Physical Exam ?Vitals and nursing note reviewed.  ?Constitutional:   ?   Appearance: Normal appearance.  ?HENT:  ?   Right Ear: Ear canal and external ear normal.  ?   Left Ear: Tympanic membrane, ear canal and external ear normal.  ?   Nose:  ?   Right Sinus: No maxillary sinus tenderness or frontal sinus tenderness.  ?   Left Sinus: No maxillary sinus tenderness or frontal sinus tenderness.  ?   Mouth/Throat:  ?   Mouth:  Mucous membranes are moist.  ?   Pharynx: Oropharynx is clear.  ?Cardiovascular:  ?   Rate and Rhythm: Normal rate and regular rhythm.  ?   Heart sounds: Normal heart sounds.  ?Pulmonary:  ?   Effort: Pulmonary effort is normal.  ?   Breath sounds: Normal breath sounds.  ?Abdominal:  ?   General: Bowel sounds are normal.  ?Neurological:  ?   Mental Status: He is alert.  ? ? ?BP 140/78 (BP Location: Right Arm, Patient Position: Sitting, Cuff Size: Normal)   Pulse 87   Temp 97.6 ?F (36.4 ?C) (Temporal)   Ht 5\' 8"  (1.727 m)   Wt 194 lb 9.6 oz (88.3 kg)   SpO2 97%   BMI 29.59 kg/m?  ?Wt Readings from Last 3 Encounters:  ?11/08/21 194 lb 9.6 oz (88.3 kg)  ?04/10/21 199 lb (90.3 kg)  ?11/13/20 199 lb 12 oz (90.6 kg)  ? ? ?Health Maintenance Due  ?Topic Date Due  ? HIV Screening  Never done  ? Hepatitis C Screening  Never done  ? Zoster Vaccines- Shingrix (1 of 2) Never done  ? COLONOSCOPY (Pts [id number] Insurance coverage will need to be confirmed)  Never done  ? INFLUENZA VACCINE  03/26/2021  ? ? ?There are no preventive care reminders to display for this patient. ? ? ?No results found for: TSH ?Lab Results  ?Component Value Date  ? WBC 7.0 05/26/2020  ? HGB 16.0 05/26/2020  ? HCT 46.2 05/26/2020  ? MCV 90.0 05/26/2020  ? PLT 231.0 05/26/2020  ? ?Lab Results  ?Component Value Date  ? NA 134 (L) 05/03/2021  ? K 3.9 05/03/2021  ? CO2 26 05/03/2021  ? GLUCOSE 96 05/03/2021  ? BUN 19 05/03/2021  ? CREATININE 1.31 (H) 05/03/2021  ? BILITOT 0.7 05/26/2020  ? ALKPHOS 68 05/26/2020  ? AST 14 05/26/2020  ? ALT 15 05/26/2020  ? PROT 6.2 05/26/2020  ? ALBUMIN 4.1 05/26/2020  ? CALCIUM 8.8 (L) 05/03/2021  ? ANIONGAP 7 05/03/2021  ? GFR 56.79 (L) 05/26/2020  ? ?Lab Results  ?Component Value Date  ? CHOL 172 05/26/2020  ? ?Lab Results  ?Component Value Date  ? HDL 39.30 05/26/2020  ? ?Lab Results  ?Component Value Date  ? LDLCALC 118 (H) 05/26/2020  ? ?Lab Results  ?Component Value Date  ? TRIG 74.0 05/26/2020  ? ?Lab Results   ?Component Value Date  ? CHOLHDL 4 05/26/2020  ? ?Lab Results  ?Component Value Date  ? HGBA1C 5.6 05/26/2020  ? ? ?   ?  Assessment & Plan:  ? ?Problem List Items Addressed This Visit   ? ?  ? Respiratory  ? Mild persistent asthma with exacerbation  ?  Patient started back on Flovent inhaler after being seen from urgent care.  Also started on albuterol inhaler with some relief.  Patient's not been covered with azithromycin for infectious purposes.  Will write patient prednisone to see if we can get cough and asthma in remission.  If unable to get in remission will need to step up his ICS therapy or change to ICS/LABA therapy.  Did encourage patient to take antihistamine if he can tolerate it.  Also discussed possibility of adding on Singulair for better control if needed.  Follow-up if no improvement or symptoms worsen. ?  ?  ? Relevant Medications  ? predniSONE (DELTASONE) 20 MG tablet  ?  ? Other  ? Acute cough - Primary  ?  Due for likely acute cough is related to asthma exacerbation.  Patient was seen in urgent care and prescribed azithromycin, Flovent, albuterol, Tessalon Perles.  Patient states he has been still using his Flovent as prescribed but still having use inhaler several times a day with some relief.  He did not try the [employer] do this to be beneficial.  We will start him on prednisone to see if we get asthma in remission and cough under control. ?  ?  ? Relevant Medications  ? predniSONE (DELTASONE) 20 MG tablet  ? ? ? ?Meds ordered this encounter  ?Medications  ? predniSONE (DELTASONE) 20 MG tablet  ?  Sig: Take 1 tablet (20 mg total) by mouth 2 (two) times daily with a meal for 3 days, THEN 1 tablet (20 mg total) daily with breakfast for 3 days.  ?  Dispense:  9 tablet  ?  Refill:  0  ?  Order Specific Question:   Supervising Provider  ?  Answer:   Roxy Manns A [[id number]]  ? ?This visit occurred during the SARS-CoV-2 public health emergency.  Safety protocols were in place, including  screening questions prior to the visit, additional usage of staff PPE, and extensive cleaning of exam room while observing appropriate contact time as indicated for disinfecting solutions.  ? ?Audria Nine, NP ? ?

## 2021-11-08 NOTE — Assessment & Plan Note (Signed)
Due for likely acute cough is related to asthma exacerbation.  Patient was seen in urgent care and prescribed azithromycin, Flovent, albuterol, Tessalon Perles.  Patient states he has been still using his Flovent as prescribed but still having use inhaler several times a day with some relief.  He did not try the [employer] do this to be beneficial.  We will start him on prednisone to see if we get asthma in remission and cough under control. ?

## 2021-11-08 NOTE — Telephone Encounter (Signed)
Patient asking for refill on Trtiamcinolone cream. ?LOV acute visit today 11/08/21 with Audria Nine ?LOV with Dr Patsy Lager was on 11/13/20  ? ?No future appointments ? ?

## 2021-12-13 ENCOUNTER — Ambulatory Visit: Payer: BC Managed Care – PPO | Admitting: Internal Medicine

## 2021-12-13 ENCOUNTER — Encounter: Payer: Self-pay | Admitting: Internal Medicine

## 2021-12-13 VITALS — BP 120/86 | HR 65 | Ht 68.0 in | Wt 196.0 lb

## 2021-12-13 DIAGNOSIS — R002 Palpitations: Secondary | ICD-10-CM | POA: Diagnosis not present

## 2021-12-13 DIAGNOSIS — I10 Essential (primary) hypertension: Secondary | ICD-10-CM | POA: Diagnosis not present

## 2021-12-13 DIAGNOSIS — R55 Syncope and collapse: Secondary | ICD-10-CM | POA: Diagnosis not present

## 2021-12-13 NOTE — Progress Notes (Signed)
? ? ? ? ?Patient Care Team: ?Hannah Beat, MD as PCP - General ? ? ?HPI ? ?Vincent Winters is a 51 y.o. male seen in follow-up for palpitations and syncope, presumably neurally mediated, sleep disordered breathing and in need of a sleep study.  9/22 AHI 5.1 ? ?Hx of hypertension   ? ?Hx of syncope, presumed neurally mediated.   ? ?He had a dizzy spell a couple of weeks ago that was quite severe.  It sounds like he has 2 distinct syndromes, the syncope with that epiphenomena has been triggered by pain.  This most recent spell and others like it have been associated with changes in position including going simply from lying to sitting.  This last 1 was associated with vertiginous sensation, no diaphoresis no nausea, lasted about 5-10 minutes; other spells have been associated with rotation of his head without changes in body position ? ?DATE TEST EF   ?2/19 Echo   55-60 % Normal   ?     ? ?  ?Date Cr K Hgb  ?10/21   16.0  ?9/22 1.31 3.9   ? ? ? ?Records and Results Reviewed  ? ?Past Medical History:  ?Diagnosis Date  ? Asthma   ? GERD (gastroesophageal reflux disease)   ? Hiatal hernia   ? Hypertension, essential 09/07/2009  ? Qualifier: Diagnosis of  By: Copland MD, Karleen Hampshire    ? Syncope   ? Presumed neurally mediated (2/19-SK)  ? ? ?Past Surgical History:  ?Procedure Laterality Date  ? APPENDECTOMY    ? ? ?Current Meds  ?Medication Sig  ? albuterol (PROVENTIL HFA;VENTOLIN HFA) 108 (90 Base) MCG/ACT inhaler INHALE 2 PUFFS INTO THE LUNGS EVERY 6 HOURS AS NEEDED  ? amLODipine (NORVASC) 5 MG tablet TAKE 1 TABLET DAILY  ? fluticasone (FLOVENT HFA) 110 MCG/ACT inhaler Inhale 1 puff into the lungs 2 (two) times daily as needed.  ? lisinopril (ZESTRIL) 5 MG tablet Take 1 tablet (5 mg total) by mouth daily.  ? triamcinolone cream (KENALOG) 0.1 % Apply 1 application. topically 2 (two) times daily.  ? ? ?No Known Allergies ? ? ? ?Review of Systems negative except from HPI and PMH ? ?Physical Exam ?BP 120/86 (BP  Location: Left Arm, Patient Position: Sitting, Cuff Size: Normal)   Pulse 65   Ht 5\' 8"  (1.727 m)   Wt 196 lb (88.9 kg)   SpO2 98%   BMI 29.80 kg/m?  ?Well developed and nourished in no acute distress ?HENT normal ?Neck supple  ?Clear ?Regular rate and rhythm, no murmurs or gallops ?Abd-soft with active BS ?No Clubbing cyanosis edema ?Skin-warm and dry ?A & Oriented  Grossly normal sensory and motor function ? ?ECG sinus at 65 ?Interval 17/07/37 ? ?CrCl cannot be calculated (Patient's most recent lab result is older than the maximum 21 days allowed.). ? ? ?Assessment and  Plan ?Syncope ?  ?Hypertension ?  ?Palpitations ? ?Vertigo? ?  ?Sleep disordered breathing ? ?Blood pressures well controlled.  We will continue him on amlodipine 5 and lisinopril 5. ? ?No interval syncope. ? ?These other episodes of instability of gait, sensation of falling and a abrupt and vigorous rotational sensation sound like vertigo.  They are relatively brief, not a characteristic with which I am familiar.  The fact that they are associated with relatively little body positional change i.e. lying--sitting however I think makes it unlikely that it is vasomotor and this probably a different mechanism.  I have asked him to be attuned  to whether head positional changes unassociated with body position changes are trigger. ? ?AHI was only 5.  Would not pursue therapy at this time ? ? ? ? ?Current medicines are reviewed at length with the patient today .  The patient does not  have concerns regarding medicines. ? ?

## 2021-12-13 NOTE — Patient Instructions (Signed)
Medication Instructions:  - Your physician recommends that you continue on your current medications as directed. Please refer to the Current Medication list given to you today.  *If you need a refill on your cardiac medications before your next appointment, please call your pharmacy*   Lab Work: - none ordered  If you have labs (blood work) drawn today and your tests are completely normal, you will receive your results only by: MyChart Message (if you have MyChart) OR A paper copy in the mail If you have any lab test that is abnormal or we need to change your treatment, we will call you to review the results.   Testing/Procedures: - none ordered   Follow-Up: At [facility], you and your health needs are our priority.  As part of our continuing mission to provide you with exceptional heart care, we have created designated Provider Care Teams.  These Care Teams include your primary Cardiologist (physician) and Advanced Practice Providers (APPs -  Physician Assistants and Nurse Practitioners) who all work together to provide you with the care you need, when you need it.  We recommend signing up for the patient portal called "MyChart".  Sign up information is provided on this After Visit Summary.  MyChart is used to connect with patients for Virtual Visits (Telemedicine).  Patients are able to view lab/test results, encounter notes, upcoming appointments, etc.  Non-urgent messages can be sent to your provider as well.   To learn more about what you can do with MyChart, go to [email or web address].    Your next appointment:   1 year(s)  The format for your next appointment:   In Person  Provider:   Steven Klein, MD    Other Instructions N/a  Important Information About Sugar       

## 2022-01-07 ENCOUNTER — Encounter: Payer: Self-pay | Admitting: *Deleted

## 2022-01-07 ENCOUNTER — Other Ambulatory Visit: Payer: Self-pay | Admitting: Family Medicine

## 2022-03-12 ENCOUNTER — Other Ambulatory Visit: Payer: Self-pay | Admitting: Family Medicine

## 2022-03-12 DIAGNOSIS — Z114 Encounter for screening for human immunodeficiency virus [HIV]: Secondary | ICD-10-CM

## 2022-03-12 DIAGNOSIS — Z13220 Encounter for screening for lipoid disorders: Secondary | ICD-10-CM

## 2022-03-12 DIAGNOSIS — Z1159 Encounter for screening for other viral diseases: Secondary | ICD-10-CM

## 2022-03-12 DIAGNOSIS — Z79899 Other long term (current) drug therapy: Secondary | ICD-10-CM

## 2022-03-12 DIAGNOSIS — Z131 Encounter for screening for diabetes mellitus: Secondary | ICD-10-CM

## 2022-03-12 DIAGNOSIS — Z125 Encounter for screening for malignant neoplasm of prostate: Secondary | ICD-10-CM

## 2022-03-14 ENCOUNTER — Other Ambulatory Visit (INDEPENDENT_AMBULATORY_CARE_PROVIDER_SITE_OTHER): Payer: BC Managed Care – PPO

## 2022-03-14 DIAGNOSIS — Z79899 Other long term (current) drug therapy: Secondary | ICD-10-CM

## 2022-03-14 DIAGNOSIS — Z114 Encounter for screening for human immunodeficiency virus [HIV]: Secondary | ICD-10-CM | POA: Diagnosis not present

## 2022-03-14 DIAGNOSIS — Z1159 Encounter for screening for other viral diseases: Secondary | ICD-10-CM | POA: Diagnosis not present

## 2022-03-14 DIAGNOSIS — Z131 Encounter for screening for diabetes mellitus: Secondary | ICD-10-CM

## 2022-03-14 DIAGNOSIS — Z13220 Encounter for screening for lipoid disorders: Secondary | ICD-10-CM

## 2022-03-14 DIAGNOSIS — Z125 Encounter for screening for malignant neoplasm of prostate: Secondary | ICD-10-CM

## 2022-03-14 LAB — CBC WITH DIFFERENTIAL/PLATELET
Basophils Absolute: 0 10*3/uL (ref 0.0–0.1)
Basophils Relative: 0.6 % (ref 0.0–3.0)
Eosinophils Absolute: 0.2 10*3/uL (ref 0.0–0.7)
Eosinophils Relative: 3.8 % (ref 0.0–5.0)
HCT: 44.1 % (ref 39.0–52.0)
Hemoglobin: 14.9 g/dL (ref 13.0–17.0)
Lymphocytes Relative: 18.6 % (ref 12.0–46.0)
Lymphs Abs: 1.2 10*3/uL (ref 0.7–4.0)
MCHC: 33.8 g/dL (ref 30.0–36.0)
MCV: 90.8 fl (ref 78.0–100.0)
Monocytes Absolute: 0.5 10*3/uL (ref 0.1–1.0)
Monocytes Relative: 8.1 % (ref 3.0–12.0)
Neutro Abs: 4.4 10*3/uL (ref 1.4–7.7)
Neutrophils Relative %: 68.9 % (ref 43.0–77.0)
Platelets: 210 10*3/uL (ref 150.0–400.0)
RBC: 4.86 Mil/uL (ref 4.22–5.81)
RDW: 13 % (ref 11.5–15.5)
WBC: 6.5 10*3/uL (ref 4.0–10.5)

## 2022-03-14 LAB — BASIC METABOLIC PANEL
BUN: 17 mg/dL (ref 6–23)
CO2: 28 mEq/L (ref 19–32)
Calcium: 8.6 mg/dL (ref 8.4–10.5)
Chloride: 103 mEq/L (ref 96–112)
Creatinine, Ser: 1.19 mg/dL (ref 0.40–1.50)
GFR: 70.32 mL/min (ref >60.00)
Glucose, Bld: 90 mg/dL (ref 70–99)
Potassium: 4.5 mEq/L (ref 3.5–5.1)
Sodium: 137 mEq/L (ref 135–145)

## 2022-03-14 LAB — LIPID PANEL
Cholesterol: 162 mg/dL (ref 0–200)
HDL: 38.2 mg/dL — ABNORMAL LOW (ref >39.00)
LDL Cholesterol: 105 mg/dL — ABNORMAL HIGH (ref 0–99)
NonHDL: 123.91
Total CHOL/HDL Ratio: 4
Triglycerides: 94 mg/dL (ref 0.0–149.0)
VLDL: 18.8 mg/dL (ref 0.0–40.0)

## 2022-03-14 LAB — HEPATIC FUNCTION PANEL
ALT: 15 U/L (ref 0–53)
AST: 16 U/L (ref 0–37)
Albumin: 3.9 g/dL (ref 3.5–5.2)
Alkaline Phosphatase: 65 U/L (ref 39–117)
Bilirubin, Direct: 0.1 mg/dL (ref 0.0–0.3)
Total Bilirubin: 0.6 mg/dL (ref 0.2–1.2)
Total Protein: 5.9 g/dL — ABNORMAL LOW (ref 6.0–8.3)

## 2022-03-14 LAB — HEMOGLOBIN A1C: Hgb A1c MFr Bld: 5.5 % (ref 4.6–6.5)

## 2022-03-18 LAB — HIV ANTIBODY (ROUTINE TESTING W REFLEX): HIV 1&2 Ab, 4th Generation: NONREACTIVE

## 2022-03-18 LAB — HEPATITIS C ANTIBODY: Hepatitis C Ab: NONREACTIVE

## 2022-03-18 LAB — PSA, TOTAL WITH REFLEX TO PSA, FREE: PSA, Total: 0.6 ng/mL (ref <=4.0)

## 2022-03-21 ENCOUNTER — Encounter: Payer: Self-pay | Admitting: Family Medicine

## 2022-03-21 ENCOUNTER — Ambulatory Visit (INDEPENDENT_AMBULATORY_CARE_PROVIDER_SITE_OTHER): Payer: BC Managed Care – PPO | Admitting: Family Medicine

## 2022-03-21 VITALS — BP 110/80 | HR 75 | Temp 98.6°F | Ht 67.5 in | Wt 194.4 lb

## 2022-03-21 DIAGNOSIS — Z0000 Encounter for general adult medical examination without abnormal findings: Secondary | ICD-10-CM | POA: Diagnosis not present

## 2022-03-21 NOTE — Patient Instructions (Addendum)
Try some Metamucil or Citrucel as a supplement.

## 2022-03-21 NOTE — Progress Notes (Signed)
Vincent Charo T. Vista Sawatzky, MD, CAQ [facility] [place] at [facility] [address] [place], [address]  Phone: [phone]  FAX: [phone]  Vincent Winters - 51 y.o. male  MRN [id number]  Date of Birth: [birth year]  Date: 03/21/2022  PCP: Hannah Beat, MD  Referral: Hannah Beat, MD  Chief Complaint  Patient presents with   Annual Exam   Patient Care Team: Hannah Beat, MD as PCP - General Subjective:   Vincent Winters is a 51 y.o. pleasant patient who presents with the following:  Preventative Health Maintenance Visit:  Health Maintenance Summary Reviewed and updated, unless pt declines services.  Tobacco History Reviewed. Alcohol: No concerns, no excessive use Exercise Habits: Some activity, rec at least 30 mins 5 times a week - none right now STD concerns: no risk or activity to increase risk Drug Use: None  Shingrix - declines Colon - declines Covid booster - declines  Wakes up feeling nauseated in the morning.  Will start coughing.  Sometimes will feel that way all day.  - nothing changed at all.   - not as often when not at home.  - often soft BM, sometimes 3-4 times  No one can drive him to colonoscopy.  - Does have GERD. Not much fiber.     Health Maintenance  Topic Date Due   Zoster Vaccines- Shingrix (1 of 2) Never done   COLONOSCOPY (Pts 45-14yrs Insurance coverage will need to be confirmed)  Never done   COVID-19 Vaccine (4 - Booster) 09/05/2020   INFLUENZA VACCINE  03/26/2022   TETANUS/TDAP  08/28/2026   Hepatitis C Screening  Completed   HIV Screening  Completed   HPV VACCINES  Aged Out   Immunization History  Administered Date(s) Administered   Influenza Inj Mdck Quad With Preservative 08/17/2018   Influenza,inj,Quad PF,6+ Mos 06/10/2016, 05/22/2019   Influenza-Unspecified 05/27/2015, 07/26/2017, 06/20/2020   Moderna Sars-Covid-2 Vaccination 11/18/2019, 12/22/2019    PFIZER(Purple Top)SARS-COV-2 Vaccination 07/11/2020   Tdap 08/28/2016   Patient Active Problem List   Diagnosis Date Noted   Hypertension, essential 09/07/2009    Priority: Medium    Asthma 09/23/2008    Priority: Medium    GERD 09/23/2008    Priority: Low   Mild persistent asthma with exacerbation 11/08/2021   HIATAL HERNIA 09/23/2008    Past Medical History:  Diagnosis Date   Asthma    GERD (gastroesophageal reflux disease)    Hiatal hernia    Hypertension, essential 09/07/2009   Syncope    Presumed neurally mediated (2/19-SK)    Past Surgical History:  Procedure Laterality Date   APPENDECTOMY      Family History  Problem Relation Age of Onset   Hypertension Father     Social History   Social History Narrative   Regular exercise-yes      From [employer] state    Past Medical History, Surgical History, Social History, Family History, Problem List, Medications, and Allergies have been reviewed and updated if relevant.  Review of Systems: Pertinent positives are listed above.  Otherwise, a full 14 point review of systems has been done in full and it is negative except where it is noted positive.  Objective:   BP 110/80   Pulse 75   Temp 98.6 F (37 C) (Oral)   Ht 5' 7.5" (1.715 m)   Wt 194 lb 6 oz (88.2 kg)   SpO2 98%   BMI 29.99 kg/m  Ideal Body Weight: Weight in (lb) to  have BMI = 25: 161.7  Ideal Body Weight: Weight in (lb) to have BMI = 25: 161.7 No results found.    03/21/2022    9:45 AM 11/08/2021   10:47 AM 07/03/2020    9:16 AM  Depression screen PHQ 2/9  Decreased Interest 0 0 0  Down, Depressed, Hopeless 0 0 0  PHQ - 2 Score 0 0 0     GEN: well developed, well nourished, no acute distress Eyes: conjunctiva and lids normal, PERRLA, EOMI ENT: TM clear, nares clear, oral exam WNL Neck: supple, no lymphadenopathy, no thyromegaly, no JVD Pulm: clear to auscultation and percussion, respiratory effort normal CV: regular rate and rhythm, S1-S2, no  murmur, rub or gallop, no bruits, peripheral pulses normal and symmetric, no cyanosis, clubbing, edema or varicosities GI: soft, non-tender; no hepatosplenomegaly, masses; active bowel sounds all quadrants GU: deferred Lymph: no cervical, axillary or inguinal adenopathy MSK: gait normal, muscle tone and strength WNL, no joint swelling, effusions, discoloration, crepitus  SKIN: clear, good turgor, color WNL, no rashes, lesions, or ulcerations Neuro: normal mental status, normal strength, sensation, and motion Psych: alert; oriented to person, place and time, normally interactive and not anxious or depressed in appearance.  All labs reviewed with patient. Results for orders placed or performed in visit on 03/14/22  Hepatitis C antibody  Result Value Ref Range   Hepatitis C Ab NON-REACTIVE NON-REACTIVE  Lipid panel  Result Value Ref Range   Cholesterol 162 0 - 200 mg/dL   Triglycerides 27.7 0.0 - 149.0 mg/dL   HDL 82.42 (L) >35.36 mg/dL   VLDL 14.4 0.0 - 31.5 mg/dL   LDL Cholesterol 400 (H) 0 - 99 mg/dL   Total CHOL/HDL Ratio 4    NonHDL 123.91   Hepatic function panel  Result Value Ref Range   Total Bilirubin 0.6 0.2 - 1.2 mg/dL   Bilirubin, Direct 0.1 0.0 - 0.3 mg/dL   Alkaline Phosphatase 65 39 - 117 U/L   AST 16 0 - 37 U/L   ALT 15 0 - 53 U/L   Total Protein 5.9 (L) 6.0 - 8.3 g/dL   Albumin 3.9 3.5 - 5.2 g/dL  Basic metabolic panel  Result Value Ref Range   Sodium 137 135 - 145 mEq/L   Potassium 4.5 3.5 - 5.1 mEq/L   Chloride 103 96 - 112 mEq/L   CO2 28 19 - 32 mEq/L   Glucose, Bld 90 70 - 99 mg/dL   BUN 17 6 - 23 mg/dL   Creatinine, Ser 8.67 0.40 - 1.50 mg/dL   GFR 61.95 >09.32 mL/min   Calcium 8.6 8.4 - 10.5 mg/dL  CBC with Differential/Platelet  Result Value Ref Range   WBC 6.5 4.0 - 10.5 K/uL   RBC 4.86 4.22 - 5.81 Mil/uL   Hemoglobin 14.9 13.0 - 17.0 g/dL   HCT 67.1 24.5 - 80.9 %   MCV 90.8 78.0 - 100.0 fl   MCHC 33.8 30.0 - 36.0 g/dL   RDW 98.3 38.2 - 50.5 %    Platelets 210.0 150.0 - 400.0 K/uL   Neutrophils Relative % 68.9 43.0 - 77.0 %   Lymphocytes Relative 18.6 12.0 - 46.0 %   Monocytes Relative 8.1 3.0 - 12.0 %   Eosinophils Relative 3.8 0.0 - 5.0 %   Basophils Relative 0.6 0.0 - 3.0 %   Neutro Abs 4.4 1.4 - 7.7 K/uL   Lymphs Abs 1.2 0.7 - 4.0 K/uL   Monocytes Absolute 0.5 0.1 - 1.0 K/uL  Eosinophils Absolute 0.2 0.0 - 0.7 K/uL   Basophils Absolute 0.0 0.0 - 0.1 K/uL  Hemoglobin A1c  Result Value Ref Range   Hgb A1c MFr Bld 5.5 4.6 - 6.5 %  PSA, Total with Reflex to PSA, Free  Result Value Ref Range   PSA, Total 0.6 < OR = 4.0 ng/mL  HIV Antibody (routine testing w rflx)  Result Value Ref Range   HIV 1&2 Ab, 4th Generation NON-REACTIVE NON-REACTIVE    Assessment and Plan:     ICD-10-CM   1. Healthcare maintenance  Z00.00      He would like to hold off getting a Shingrix vaccine, and he is not interested in getting additional COVID vaccination  Does understand colonoscopy would be a good idea, but at this point he does not feel like he can do it, and he has no transportation available to him, no family lives close.  With his ongoing nausea and loose bowel movements, I think that most reasonable for step would be to increase his fiber intake.  Right now, he is not taking much in, and I think adding in some additional vegetables as well as some bran cereal might make sense.  He is also going to try some Metamucil or Citrucel.  Health Maintenance Exam: The patient's preventative maintenance and recommended screening tests for an annual wellness exam were reviewed in full today. Brought up to date unless services declined.  Counselled on the importance of diet, exercise, and its role in overall health and mortality. The patient's FH and SH was reviewed, including their home life, tobacco status, and drug and alcohol status.  Follow-up in 1 year for physical exam or additional follow-up below.  Follow-up: No follow-ups on  file. Or follow-up in 1 year if not noted.  No orders of the defined types were placed in this encounter.  Medications Discontinued During This Encounter  Medication Reason   aspirin EC 81 MG tablet Completed Course   azithromycin (ZITHROMAX) 250 MG tablet Completed Course   benzonatate (TESSALON) 200 MG capsule Completed Course   hydrochlorothiazide (HYDRODIURIL) 12.5 MG tablet Completed Course   No orders of the defined types were placed in this encounter.   Signed,  Elpidio Galea. Nabeeha Badertscher, MD   Allergies as of 03/21/2022   No Known Allergies      Medication List        Accurate as of March 21, 2022 10:16 AM. If you have any questions, ask your nurse or doctor.          STOP taking these medications    aspirin EC 81 MG tablet Stopped by: Hannah Beat, MD   azithromycin 250 MG tablet Commonly known as: ZITHROMAX Stopped by: Hannah Beat, MD   benzonatate 200 MG capsule Commonly known as: TESSALON Stopped by: Hannah Beat, MD   hydrochlorothiazide 12.5 MG tablet Commonly known as: HYDRODIURIL Stopped by: Hannah Beat, MD       TAKE these medications    albuterol 108 (90 Base) MCG/ACT inhaler Commonly known as: VENTOLIN HFA INHALE 2 PUFFS INTO THE LUNGS EVERY 6 HOURS AS NEEDED   amLODipine 5 MG tablet Commonly known as: NORVASC TAKE 1 TABLET DAILY   fluticasone 110 MCG/ACT inhaler Commonly known as: FLOVENT HFA Inhale 1 puff into the lungs 2 (two) times daily as needed.   lisinopril 5 MG tablet Commonly known as: ZESTRIL Take 1 tablet (5 mg total) by mouth daily.   triamcinolone cream 0.1 % Commonly known as: KENALOG Apply 1  application. topically 2 (two) times daily.

## 2022-04-08 ENCOUNTER — Other Ambulatory Visit: Payer: Self-pay | Admitting: Family Medicine

## 2022-04-08 ENCOUNTER — Other Ambulatory Visit: Payer: Self-pay | Admitting: Internal Medicine

## 2022-08-13 ENCOUNTER — Other Ambulatory Visit: Payer: Self-pay | Admitting: Family Medicine

## 2022-11-08 ENCOUNTER — Other Ambulatory Visit: Payer: Self-pay | Admitting: Internal Medicine

## 2022-11-08 NOTE — Telephone Encounter (Signed)
Please contact pt for future appointment. °Pt overdue for 12 month f/u. °Pt needing refills. °

## 2022-11-27 NOTE — Telephone Encounter (Signed)
Left voicemail to schedule 1 year follow up appointment.

## 2023-01-02 ENCOUNTER — Ambulatory Visit: Payer: BC Managed Care – PPO | Admitting: Internal Medicine

## 2023-02-18 ENCOUNTER — Other Ambulatory Visit: Payer: Self-pay | Admitting: Internal Medicine

## 2023-02-18 ENCOUNTER — Other Ambulatory Visit: Payer: Self-pay | Admitting: Family Medicine

## 2023-02-18 NOTE — Telephone Encounter (Signed)
Please schedule CPE with fasting labs prior with Dr. Patsy Lager for after 03/18/2023.

## 2023-02-18 NOTE — Telephone Encounter (Signed)
Spoke with Reuel Boom and advised Amlodipine refill has been sent to [facility].

## 2023-02-18 NOTE — Telephone Encounter (Signed)
Patient called back and scheduled cpe, asked if medication could now be refilled. Please advise, thank you.

## 2023-02-18 NOTE — Telephone Encounter (Signed)
Lvmtcb, sent mychart message  

## 2023-03-17 DIAGNOSIS — R002 Palpitations: Secondary | ICD-10-CM | POA: Insufficient documentation

## 2023-03-17 DIAGNOSIS — R55 Syncope and collapse: Secondary | ICD-10-CM | POA: Insufficient documentation

## 2023-03-26 ENCOUNTER — Encounter (INDEPENDENT_AMBULATORY_CARE_PROVIDER_SITE_OTHER): Payer: Self-pay

## 2023-03-27 ENCOUNTER — Encounter: Payer: Self-pay | Admitting: Internal Medicine

## 2023-03-27 ENCOUNTER — Ambulatory Visit: Payer: BC Managed Care – PPO | Attending: Internal Medicine | Admitting: Internal Medicine

## 2023-03-27 VITALS — BP 120/88 | HR 73 | Ht 67.5 in | Wt 203.2 lb

## 2023-03-27 DIAGNOSIS — R002 Palpitations: Secondary | ICD-10-CM

## 2023-03-27 DIAGNOSIS — R079 Chest pain, unspecified: Secondary | ICD-10-CM | POA: Diagnosis not present

## 2023-03-27 DIAGNOSIS — R0600 Dyspnea, unspecified: Secondary | ICD-10-CM | POA: Diagnosis not present

## 2023-03-27 DIAGNOSIS — R55 Syncope and collapse: Secondary | ICD-10-CM

## 2023-03-27 NOTE — Progress Notes (Signed)
      Patient Care Team: Hannah Beat, MD as PCP - General   HPI  Vincent Winters is a 52 y.o. male seen in follow-up for palpitations and syncope, presumably neurally mediated, sleep disordered breathing and in need of a sleep study.  9/22 AHI 5.1  Hx of hypertension    Hx of syncope, presumed neurally mediated.    Complains of exercise intolerance with lightheadedness as well as dyspnea and fatigue.  Short of breath 1 flight of stairs.  Stable two-pillow orthopnea.  Occasional peripheral edema.  Some vague chest uneasiness.  Some bendopnea.   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)   DATE TEST EF   2/19 Echo   55-60 % Normal   2020 CT-PulmAng  No CA calcificiations     Date Cr K Hgb  10/21   16.0  9/22 1.31 3.9   7/23 1.19 4.5 14.9     Records and Results Reviewed   Past Medical History:  Diagnosis Date   Asthma    GERD (gastroesophageal reflux disease)    Hiatal hernia    Hypertension, essential 09/07/2009   Syncope    Presumed neurally mediated (2/19-SK)    Past Surgical History:  Procedure Laterality Date   APPENDECTOMY      Current Meds  Medication Sig   albuterol (PROVENTIL HFA;VENTOLIN HFA) 108 (90 Base) MCG/ACT inhaler INHALE 2 PUFFS INTO THE LUNGS EVERY 6 HOURS AS NEEDED   amLODipine (NORVASC) 5 MG tablet TAKE 1 TABLET DAILY   fluticasone (FLOVENT HFA) 110 MCG/ACT inhaler Inhale 1 puff into the lungs 2 (two) times daily as needed.   lisinopril (ZESTRIL) 5 MG tablet TAKE 1 TABLET (5 MG TOTAL) BY MOUTH DAILY.   triamcinolone cream (KENALOG) 0.1 % Apply 1 application. topically 2 (two) times daily.    No Known Allergies    Review of Systems negative except from HPI and PMH  Physical Exam BP 120/88 (BP Location: Left Arm, Patient Position: Sitting, Cuff Size: Large)   Pulse 73   Ht 5' 7.5" (1.715 m)   Wt 203 lb 3.2 oz (92.2 kg)   SpO2 97%   BMI 31.36 kg/m  Well developed and nourished in  no acute distress HENT normal Neck supple with JVP-  flat   Clear Regular rate and rhythm, no murmurs or gallops Abd-soft with active BS No Clubbing cyanosis edema Skin-warm and dry A & Oriented  Grossly normal sensory and motor function  ECG sinus at 73 Interval 18/07/36 Rightward axis  CrCl cannot be calculated (Patient's most recent lab result is older than the maximum 21 days allowed.).   Assessment and  Plan Syncope   Hypertension   Palpitations  Dyspnea on exertion   ECG Axis rightward  No interval syncope  Blood pressure is well-controlled on his combination of lisinopril and amlodipine; will continue  Dyspnea on exertion is concerning.  Could be conditioning.  Associated with lightheadedness, however is concerning and will undertake GXT.  With his vague chest discomfort>> calcium score and possible CTA and with his dyspnea and rightward axis will check an echocardiogram looking both for RV LV function and pulmonary pressures  Prior negative sleep study     Current medicines are reviewed at length with the patient today .  The patient does not  have concerns regarding medicines.

## 2023-03-27 NOTE — Patient Instructions (Addendum)
Medication Instructions:  The current medical regimen is effective;  continue present plan and medications.  *If you need a refill on your cardiac medications before your next appointment, please call your pharmacy*   Testing/Procedures:  Your physician has requested that you have an echocardiogram. Echocardiography is a painless test that uses sound waves to create images of your heart. It provides your doctor with information about the size and shape of your heart and how well your heart's chambers and valves are working.   You may receive an ultrasound enhancing agent through an IV if needed to better visualize your heart during the echo. This procedure takes approximately one hour.  There are no restrictions for this procedure.  This will take place at [address] [address], [place] [address]  Your provider has ordered a exercise tolerance test (WITH DR.KLEIN). This test will evaluate the blood supply to your heart muscle during periods of exercise and rest. For this test, you will raise your heart rate by walking on a treadmill at different levels.   you may eat a light breakfast/ lunch prior to your procedure no caffeine for 24 hours prior to your test (coffee, tea, soft drinks, or chocolate)  no smoking/ vaping for 4 hours prior to your test you may take your regular medications the day of your test bring any inhalers with you to your test wear comfortable clothing & tennis/ non-skid shoes to walk on the treadmill  This will take place at [address] ([facility] [address], [place] [address]    Your physician has recommended that you have CT Coronary Calcium Score.  - $99 out of pocket cost at the time of your test - Call [phone] to schedule at your convenience.  [facility] [address] [place], [place] [address]   Coronary CalciumScan A coronary calcium scan is an imaging test  used to look for deposits of calcium and other fatty materials (plaques) in the inner lining of the blood vessels of the heart (coronary arteries). These deposits of calcium and plaques can partly clog and narrow the coronary arteries without producing any symptoms or warning signs. This puts a person at risk for a heart attack. This test can detect these deposits before symptoms develop. Tell a health care provider about: Any allergies you have. All medicines you are taking, including vitamins, herbs, eye drops, creams, and over-the-counter medicines. Any problems you or family members have had with anesthetic medicines. Any blood disorders you have. Any surgeries you have had. Any medical conditions you have. Whether you are pregnant or may be pregnant. What are the risks? Generally, this is a safe procedure. However, problems may occur, including: Harm to a pregnant woman and her unborn baby. This test involves the use of radiation. Radiation exposure can be dangerous to a pregnant woman and her unborn baby. If you are pregnant, you generally should not have this procedure done. Slight increase in the risk of cancer. This is because of the radiation involved in the test. What happens before the procedure? No preparation is needed for this procedure. What happens during the procedure? You will undress and remove any jewelry around your neck or chest. You will put on a hospital gown. Sticky electrodes will be placed on your chest. The electrodes will be connected to an electrocardiogram (ECG) machine to record a tracing of the electrical activity of your heart. A CT scanner will take pictures of your  heart. During this time, you will be asked to lie still and hold your breath for 2-3 seconds while a picture of your heart is being taken. The procedure may vary among health care providers and hospitals. What happens after the procedure? You can get dressed. You can return to your normal  activities. It is up to you to get the results of your test. Ask your health care provider, or the department that is doing the test, when your results will be ready. Summary A coronary calcium scan is an imaging test used to look for deposits of calcium and other fatty materials (plaques) in the inner lining of the blood vessels of the heart (coronary arteries). Generally, this is a safe procedure. Tell your health care provider if you are pregnant or may be pregnant. No preparation is needed for this procedure. A CT scanner will take pictures of your heart. You can return to your normal activities after the scan is done. This information is not intended to replace advice given to you by your health care provider. Make sure you discuss any questions you have with your health care provider. Document Released: 02/08/2008 Document Revised: 07/01/2016 Document Reviewed: 07/01/2016 Elsevier Interactive Patient Education  2017 [employer].    Follow-Up: At [facility], you and your health needs are our priority.  As part of our continuing mission to provide you with exceptional heart care, we have created designated Provider Care Teams.  These Care Teams include your primary Cardiologist (physician) and Advanced Practice Providers (APPs -  Physician Assistants and Nurse Practitioners) who all work together to provide you with the care you need, when you need it.  We recommend signing up for the patient portal called "MyChart".  Sign up information is provided on this After Visit Summary.  MyChart is used to connect with patients for Virtual Visits (Telemedicine).  Patients are able to view lab/test results, encounter notes, upcoming appointments, etc.  Non-urgent messages can be sent to your provider as well.   To learn more about what you can do with MyChart, go to [email or web address].    Your next appointment:   12 month(s)  Provider:   Sherryl Manges, MD

## 2023-04-03 ENCOUNTER — Other Ambulatory Visit: Payer: Self-pay | Admitting: Family Medicine

## 2023-04-03 DIAGNOSIS — Z13220 Encounter for screening for lipoid disorders: Secondary | ICD-10-CM

## 2023-04-03 DIAGNOSIS — Z125 Encounter for screening for malignant neoplasm of prostate: Secondary | ICD-10-CM

## 2023-04-03 DIAGNOSIS — Z131 Encounter for screening for diabetes mellitus: Secondary | ICD-10-CM

## 2023-04-03 DIAGNOSIS — Z79899 Other long term (current) drug therapy: Secondary | ICD-10-CM

## 2023-04-04 ENCOUNTER — Ambulatory Visit: Payer: BC Managed Care – PPO

## 2023-04-05 DIAGNOSIS — J069 Acute upper respiratory infection, unspecified: Secondary | ICD-10-CM | POA: Diagnosis not present

## 2023-04-08 ENCOUNTER — Ambulatory Visit: Payer: BC Managed Care – PPO | Admitting: Internal Medicine

## 2023-04-08 ENCOUNTER — Ambulatory Visit: Payer: BC Managed Care – PPO

## 2023-04-11 ENCOUNTER — Other Ambulatory Visit (INDEPENDENT_AMBULATORY_CARE_PROVIDER_SITE_OTHER): Payer: BC Managed Care – PPO

## 2023-04-11 DIAGNOSIS — Z13220 Encounter for screening for lipoid disorders: Secondary | ICD-10-CM | POA: Diagnosis not present

## 2023-04-11 DIAGNOSIS — Z131 Encounter for screening for diabetes mellitus: Secondary | ICD-10-CM | POA: Diagnosis not present

## 2023-04-11 DIAGNOSIS — Z79899 Other long term (current) drug therapy: Secondary | ICD-10-CM | POA: Diagnosis not present

## 2023-04-11 DIAGNOSIS — Z125 Encounter for screening for malignant neoplasm of prostate: Secondary | ICD-10-CM | POA: Diagnosis not present

## 2023-04-11 LAB — CBC WITH DIFFERENTIAL/PLATELET
Basophils Absolute: 0 10*3/uL (ref 0.0–0.1)
Basophils Relative: 0.4 % (ref 0.0–3.0)
Eosinophils Absolute: 0.3 10*3/uL (ref 0.0–0.7)
Eosinophils Relative: 5.4 % — ABNORMAL HIGH (ref 0.0–5.0)
HCT: 46.4 % (ref 39.0–52.0)
Hemoglobin: 15.7 g/dL (ref 13.0–17.0)
Lymphocytes Relative: 23.7 % (ref 12.0–46.0)
Lymphs Abs: 1.3 10*3/uL (ref 0.7–4.0)
MCHC: 33.7 g/dL (ref 30.0–36.0)
MCV: 89.5 fl (ref 78.0–100.0)
Monocytes Absolute: 0.5 10*3/uL (ref 0.1–1.0)
Monocytes Relative: 9.3 % (ref 3.0–12.0)
Neutro Abs: 3.2 10*3/uL (ref 1.4–7.7)
Neutrophils Relative %: 61.2 % (ref 43.0–77.0)
Platelets: 240 10*3/uL (ref 150.0–400.0)
RBC: 5.19 Mil/uL (ref 4.22–5.81)
RDW: 13.3 % (ref 11.5–15.5)
WBC: 5.3 10*3/uL (ref 4.0–10.5)

## 2023-04-11 LAB — LIPID PANEL
Cholesterol: 160 mg/dL (ref 0–200)
HDL: 33.5 mg/dL — ABNORMAL LOW (ref >39.00)
LDL Cholesterol: 108 mg/dL — ABNORMAL HIGH (ref 0–99)
NonHDL: 126.93
Total CHOL/HDL Ratio: 5
Triglycerides: 95 mg/dL (ref 0.0–149.0)
VLDL: 19 mg/dL (ref 0.0–40.0)

## 2023-04-11 LAB — BASIC METABOLIC PANEL
BUN: 19 mg/dL (ref 6–23)
CO2: 29 meq/L (ref 19–32)
Calcium: 9 mg/dL (ref 8.4–10.5)
Chloride: 100 meq/L (ref 96–112)
Creatinine, Ser: 1.14 mg/dL (ref 0.40–1.50)
GFR: 73.48 mL/min (ref >60.00)
Glucose, Bld: 105 mg/dL — ABNORMAL HIGH (ref 70–99)
Potassium: 4.6 meq/L (ref 3.5–5.1)
Sodium: 134 meq/L — ABNORMAL LOW (ref 135–145)

## 2023-04-11 LAB — HEPATIC FUNCTION PANEL
ALT: 26 U/L (ref 0–53)
AST: 20 U/L (ref 0–37)
Albumin: 4.1 g/dL (ref 3.5–5.2)
Alkaline Phosphatase: 70 U/L (ref 39–117)
Bilirubin, Direct: 0.1 mg/dL (ref 0.0–0.3)
Total Bilirubin: 0.5 mg/dL (ref 0.2–1.2)
Total Protein: 6.4 g/dL (ref 6.0–8.3)

## 2023-04-14 LAB — HEMOGLOBIN A1C: Hgb A1c MFr Bld: 5.8 % (ref 4.6–6.5)

## 2023-04-14 LAB — PSA, TOTAL WITH REFLEX TO PSA, FREE: PSA, Total: 1.2 ng/mL (ref <=4.0)

## 2023-04-15 NOTE — Progress Notes (Signed)
Gamaliel Charney T. Anthonymichael Munday, MD, CAQ Sports Medicine [facility] at [facility] [address] [place], [address]  Phone: [phone]  FAX: [phone]  Vincent Winters - 52 y.o. male  MRN [id number]  Date of Birth: [birth year]  Date: 04/17/2023  PCP: Hannah Beat, MD  Referral: Hannah Beat, MD  Chief Complaint  Patient presents with   Annual Exam   Patient Care Team: Hannah Beat, MD as PCP - General Subjective:   Vincent Winters is a 52 y.o. pleasant patient who presents with the following:  Preventative Health Maintenance Visit:  Health Maintenance Summary Reviewed and updated, unless pt declines services.  Tobacco History Reviewed. Alcohol: No concerns, no excessive use - almost never Exercise Habits: Some activity, rec at least 30 mins 5 times a week - none STD concerns: no risk or activity to increase risk Drug Use: None  Colon ca scr - cologuard Shingrix - declines for now Annual covid booster  Coronary calcium score and treadmill test is pending  HTN: Tolerating all medications without side effects -he is currently on amlodipine 5 mg and lisinopril 5 mg. Stable and at goal No CP, no sob. No HA.  BP Readings from Last 3 Encounters:  04/17/23 119/81  03/27/23 120/88  03/21/22 110/80    Basic Metabolic Panel:    Component Value Date/Time   NA 134 (L) 04/11/2023 0855   NA 136 04/29/2014 1747   K 4.6 04/11/2023 0855   K 3.8 04/29/2014 1747   CL 100 04/11/2023 0855   CL 104 04/29/2014 1747   CO2 29 04/11/2023 0855   CO2 25 04/29/2014 1747   BUN 19 04/11/2023 0855   BUN 11 04/29/2014 1747   CREATININE 1.14 04/11/2023 0855   CREATININE 1.37 (H) 04/29/2014 1747   GLUCOSE 105 (H) 04/11/2023 0855   GLUCOSE 87 04/29/2014 1747   CALCIUM 9.0 04/11/2023 0855   CALCIUM 8.5 04/29/2014 1747     Health Maintenance  Topic Date Due   Zoster Vaccines- Shingrix (1 of 2) Never done   Colonoscopy  Never done    COVID-19 Vaccine (4 - 2023-24 season) 04/26/2022   INFLUENZA VACCINE  11/24/2023 (Originally 03/27/2023)   DTaP/Tdap/Td (2 - Td or Tdap) 08/28/2026   Hepatitis C Screening  Completed   HIV Screening  Completed   HPV VACCINES  Aged Out   Immunization History  Administered Date(s) Administered   Influenza Inj Mdck Quad With Preservative 08/17/2018   Influenza,inj,Quad PF,6+ Mos 06/10/2016, 05/22/2019   Influenza-Unspecified 05/27/2015, 07/26/2017, 06/20/2020   Moderna Sars-Covid-2 Vaccination 11/18/2019, 12/22/2019   PFIZER(Purple Top)SARS-COV-2 Vaccination 07/11/2020   Tdap 08/28/2016   Patient Active Problem List   Diagnosis Date Noted   Hypertension, essential 09/07/2009    Priority: Medium    Asthma 09/23/2008    Priority: Medium    GERD 09/23/2008    Priority: Low   HIATAL HERNIA 09/23/2008    Past Medical History:  Diagnosis Date   Asthma    GERD (gastroesophageal reflux disease)    Hiatal hernia    Hypertension, essential 09/07/2009   Syncope    Presumed neurally mediated (2/19-SK)    Past Surgical History:  Procedure Laterality Date   APPENDECTOMY      Family History  Problem Relation Age of Onset   Hypertension Father     Social History   Social History Narrative   Regular exercise-yes      From [employer] state    Past Medical History, Surgical History, Social  History, Family History, Problem List, Medications, and Allergies have been reviewed and updated if relevant.  Review of Systems: Pertinent positives are listed above.  Otherwise, a full 14 point review of systems has been done in full and it is negative except where it is noted positive.  Objective:   BP 119/81 (BP Location: Left Arm, Patient Position: Sitting, Cuff Size: Normal)   Pulse 75   Temp 98.6 F (37 C) (Temporal)   Ht 5' 7.5" (1.715 m)   Wt 198 lb 2 oz (89.9 kg)   SpO2 98%   BMI 30.57 kg/m  Ideal Body Weight: Weight in (lb) to have BMI = 25: 161.7  Ideal Body Weight: Weight in  (lb) to have BMI = 25: 161.7 No results found.    04/17/2023    9:06 AM 03/21/2022    9:45 AM 11/08/2021   10:47 AM 07/03/2020    9:16 AM  Depression screen PHQ 2/9  Decreased Interest 0 0 0 0  Down, Depressed, Hopeless 0 0 0 0  PHQ - 2 Score 0 0 0 0     GEN: well developed, well nourished, no acute distress Eyes: conjunctiva and lids normal, PERRLA, EOMI ENT: TM clear, nares clear, oral exam WNL Neck: supple, no lymphadenopathy, no thyromegaly, no JVD Pulm: clear to auscultation and percussion, respiratory effort normal CV: regular rate and rhythm, S1-S2, no murmur, rub or gallop, no bruits, peripheral pulses normal and symmetric, no cyanosis, clubbing, edema or varicosities GI: soft, non-tender; no hepatosplenomegaly, masses; active bowel sounds all quadrants GU: deferred Lymph: no cervical, axillary or inguinal adenopathy MSK: gait normal, muscle tone and strength WNL, no joint swelling, effusions, discoloration, crepitus  SKIN: clear, good turgor, color WNL, no rashes, lesions, or ulcerations Neuro: normal mental status, normal strength, sensation, and motion Psych: alert; oriented to person, place and time, normally interactive and not anxious or depressed in appearance.  All labs reviewed with patient. Results for orders placed or performed in visit on 04/11/23  PSA, Total with Reflex to PSA, Free  Result Value Ref Range   PSA, Total 1.2 < OR = 4.0 ng/mL  Lipid panel  Result Value Ref Range   Cholesterol 160 0 - 200 mg/dL   Triglycerides 16.1 0.0 - 149.0 mg/dL   HDL 09.60 (L) >45.40 mg/dL   VLDL 98.1 0.0 - 19.1 mg/dL   LDL Cholesterol 478 (H) 0 - 99 mg/dL   Total CHOL/HDL Ratio 5    NonHDL 126.93   Hemoglobin A1c  Result Value Ref Range   Hgb A1c MFr Bld 5.8 4.6 - 6.5 %  Hepatic function panel  Result Value Ref Range   Total Bilirubin 0.5 0.2 - 1.2 mg/dL   Bilirubin, Direct 0.1 0.0 - 0.3 mg/dL   Alkaline Phosphatase 70 39 - 117 U/L   AST 20 0 - 37 U/L   ALT 26 0  - 53 U/L   Total Protein 6.4 6.0 - 8.3 g/dL   Albumin 4.1 3.5 - 5.2 g/dL  CBC with Differential/Platelet  Result Value Ref Range   WBC 5.3 4.0 - 10.5 K/uL   RBC 5.19 4.22 - 5.81 Mil/uL   Hemoglobin 15.7 13.0 - 17.0 g/dL   HCT 29.5 62.1 - 30.8 %   MCV 89.5 78.0 - 100.0 fl   MCHC 33.7 30.0 - 36.0 g/dL   RDW 65.7 84.6 - 96.2 %   Platelets 240.0 150.0 - 400.0 K/uL   Neutrophils Relative % 61.2 43.0 - 77.0 %  Lymphocytes Relative 23.7 12.0 - 46.0 %   Monocytes Relative 9.3 3.0 - 12.0 %   Eosinophils Relative 5.4 (H) 0.0 - 5.0 %   Basophils Relative 0.4 0.0 - 3.0 %   Neutro Abs 3.2 1.4 - 7.7 K/uL   Lymphs Abs 1.3 0.7 - 4.0 K/uL   Monocytes Absolute 0.5 0.1 - 1.0 K/uL   Eosinophils Absolute 0.3 0.0 - 0.7 K/uL   Basophils Absolute 0.0 0.0 - 0.1 K/uL  Basic metabolic panel  Result Value Ref Range   Sodium 134 (L) 135 - 145 mEq/L   Potassium 4.6 3.5 - 5.1 mEq/L   Chloride 100 96 - 112 mEq/L   CO2 29 19 - 32 mEq/L   Glucose, Bld 105 (H) 70 - 99 mg/dL   BUN 19 6 - 23 mg/dL   Creatinine, Ser 0.96 0.40 - 1.50 mg/dL   GFR 04.54 >09.81 mL/min   Calcium 9.0 8.4 - 10.5 mg/dL    Assessment and Plan:     ICD-10-CM   1. Healthcare maintenance  Z00.00     2. Colon cancer screening  Z12.11 Cologuard     Globally, he is doing fairly well.  Primarily he can work on his diet, lose a little bit of weight, and get more exercise as his primary goals.  He is open to doing Cologuard. Declines Shingrix  Health Maintenance Exam: The patient's preventative maintenance and recommended screening tests for an annual wellness exam were reviewed in full today. Brought up to date unless services declined.  Counselled on the importance of diet, exercise, and its role in overall health and mortality. The patient's FH and SH was reviewed, including their home life, tobacco status, and drug and alcohol status.  Follow-up in 1 year for physical exam or additional follow-up below.  Disposition: Return in  about 1 year (around 04/16/2024).  No orders of the defined types were placed in this encounter.  There are no discontinued medications. Orders Placed This Encounter  Procedures   Cologuard    Signed,  Ekansh Sherk T. Yolette Hastings, MD   Allergies as of 04/17/2023   No Known Allergies      Medication List        Accurate as of April 17, 2023 11:59 PM. If you have any questions, ask your nurse or doctor.          albuterol 108 (90 Base) MCG/ACT inhaler Commonly known as: VENTOLIN HFA INHALE 2 PUFFS INTO THE LUNGS EVERY 6 HOURS AS NEEDED   amLODipine 5 MG tablet Commonly known as: NORVASC TAKE 1 TABLET DAILY   fluticasone 110 MCG/ACT inhaler Commonly known as: FLOVENT HFA Inhale 1 puff into the lungs 2 (two) times daily as needed.   lisinopril 5 MG tablet Commonly known as: ZESTRIL TAKE 1 TABLET (5 MG TOTAL) BY MOUTH DAILY.   triamcinolone cream 0.1 % Commonly known as: KENALOG Apply 1 application. topically 2 (two) times daily.

## 2023-04-17 ENCOUNTER — Encounter: Payer: Self-pay | Admitting: Family Medicine

## 2023-04-17 ENCOUNTER — Ambulatory Visit (INDEPENDENT_AMBULATORY_CARE_PROVIDER_SITE_OTHER): Payer: BC Managed Care – PPO | Admitting: Family Medicine

## 2023-04-17 VITALS — BP 119/81 | HR 75 | Temp 98.6°F | Ht 67.5 in | Wt 198.1 lb

## 2023-04-17 DIAGNOSIS — Z1211 Encounter for screening for malignant neoplasm of colon: Secondary | ICD-10-CM | POA: Diagnosis not present

## 2023-04-17 DIAGNOSIS — Z0000 Encounter for general adult medical examination without abnormal findings: Secondary | ICD-10-CM | POA: Diagnosis not present

## 2023-04-22 ENCOUNTER — Other Ambulatory Visit: Payer: Self-pay | Admitting: Internal Medicine

## 2023-04-22 DIAGNOSIS — R002 Palpitations: Secondary | ICD-10-CM

## 2023-04-22 DIAGNOSIS — R55 Syncope and collapse: Secondary | ICD-10-CM

## 2023-04-22 DIAGNOSIS — R079 Chest pain, unspecified: Secondary | ICD-10-CM

## 2023-04-22 DIAGNOSIS — R0600 Dyspnea, unspecified: Secondary | ICD-10-CM

## 2023-04-29 ENCOUNTER — Ambulatory Visit: Payer: BC Managed Care – PPO | Attending: Internal Medicine

## 2023-04-29 DIAGNOSIS — R079 Chest pain, unspecified: Secondary | ICD-10-CM

## 2023-04-29 DIAGNOSIS — R0600 Dyspnea, unspecified: Secondary | ICD-10-CM | POA: Diagnosis not present

## 2023-04-30 ENCOUNTER — Other Ambulatory Visit: Payer: Self-pay | Admitting: Internal Medicine

## 2023-04-30 LAB — ECHOCARDIOGRAM COMPLETE
AR max vel: 2.58 cm2
AV Peak grad: 3.3 mmHg
Ao pk vel: 0.91 m/s
Area-P 1/2: 2.32 cm2
Calc EF: 64.3 %
S' Lateral: 2 cm
Single Plane A2C EF: 70.2 %
Single Plane A4C EF: 57.2 %

## 2023-06-07 ENCOUNTER — Other Ambulatory Visit: Payer: Self-pay | Admitting: Family Medicine

## 2024-03-26 ENCOUNTER — Ambulatory Visit
Admission: RE | Admit: 2024-03-26 | Discharge: 2024-03-26 | Disposition: A | Discharge status: Home / Self Care | Payer: Self-pay | Source: Ambulatory Visit | Attending: Internal Medicine | Admitting: Internal Medicine

## 2024-03-26 DIAGNOSIS — R079 Chest pain, unspecified: Secondary | ICD-10-CM | POA: Insufficient documentation

## 2024-05-14 ENCOUNTER — Ambulatory Visit: Payer: Self-pay | Admitting: Student

## 2024-05-15 ENCOUNTER — Other Ambulatory Visit: Payer: Self-pay | Admitting: Internal Medicine

## 2024-07-30 ENCOUNTER — Telehealth: Payer: Self-pay | Admitting: Physician Assistant

## 2024-08-03 MED ORDER — LISINOPRIL 5 MG PO TABS: 5.0000 mg | ORAL_TABLET | Freq: Every day | ORAL | 0 refills | Status: DC

## 2024-08-03 NOTE — Telephone Encounter (Signed)
 Pt scheduled 09/03/24 with Jodie Passey.  30 day refill has been sent.

## 2024-08-03 NOTE — Telephone Encounter (Signed)
 Pt scheduled 09/03/24. He states he is completely out

## 2024-08-24 ENCOUNTER — Other Ambulatory Visit: Payer: Self-pay | Admitting: Family Medicine

## 2024-08-26 ENCOUNTER — Other Ambulatory Visit: Payer: Self-pay | Admitting: Student

## 2024-09-02 NOTE — Progress Notes (Signed)
" °  Electrophysiology Office Note:   Date:  09/03/2024  ID:  Vincent Winters, DOB 1971/05/23, MRN [id number]  Primary Cardiologist: None Electrophysiologist: Previously Dr. Fernande       History of Present Illness:   Vincent Winters is a 54 y.o. male with h/o palpitations, syncope (presumably neurally mediated), HTN, and sleep disordered breathing seen today for routine electrophysiology followup.   Since last being seen in our clinic the patient reports doing very well. He has not had any additional syncope since the initial referral to Dr. Fernande. Not bothered by palpitations. No undue SOB; Does have some issues with asthma after colds in the winter. No chest pain.  Alternates his BP med to try and limit the lability and lightheadedness that he has at times with rapid standing.   Review of systems complete and found to be negative unless listed in HPI.   EP Information / Studies Reviewed:    EKG is ordered today. Personal review as below.  EKG Interpretation Date/Time:  Friday September 03 2024 08:43:39 EST Ventricular Rate:  96 PR Interval:  180 QRS Duration:  70 QT Interval:  320 QTC Calculation: 404 R Axis:   100  Text Interpretation: Normal sinus rhythm Rightward axis Confirmed by Lesia Heck ([id number]) on 09/03/2024 8:56:01 AM    Arrhythmia/Device History Echo 04/2023 normal EF CT Cardiac Calcium 03/2024 -  Score of 0  Physical Exam:   VS:  BP 138/88   Pulse 96   Ht 5' 7.5 (1.715 m)   Wt 208 lb (94.3 kg)   SpO2 98%   BMI 32.10 kg/m    Wt Readings from Last 3 Encounters:  09/03/24 208 lb (94.3 kg)  04/17/23 198 lb 2 oz (89.9 kg)  03/27/23 203 lb 3.2 oz (92.2 kg)     GEN: No acute distress NECK: No JVD; No carotid bruits CARDIAC: Regular rate and rhythm, no murmurs, rubs, gallops RESPIRATORY:  Clear to auscultation without rales, wheezing or rhonchi  ABDOMEN: Soft, non-tender, non-distended EXTREMITIES:  No edema; No deformity   ASSESSMENT AND PLAN:     H/o Syncope Palpitations Work up has been unremarkable, including Echo and CT calcium as above.  His initial syncope and presyncope were triggered by pain and he has not had further  HTN Stable on current regimen  Refill lisinopril  and BMET today   Follow up with General Cardiology in 1 year to establish for management of his Hypertension.  He may also ask his PCP if he is comfortable managing primarily.   No current Electrophysiology needs. Happy to see as needed for any arrhythmia related concerns.   Signed, Ozell Prentice Lesia, PA-C  "

## 2024-09-03 ENCOUNTER — Ambulatory Visit: Attending: Student | Admitting: Student

## 2024-09-03 ENCOUNTER — Encounter: Payer: Self-pay | Admitting: Student

## 2024-09-03 VITALS — BP 138/88 | HR 96 | Ht 67.5 in | Wt 208.0 lb

## 2024-09-03 DIAGNOSIS — I10 Essential (primary) hypertension: Secondary | ICD-10-CM | POA: Diagnosis not present

## 2024-09-03 DIAGNOSIS — R002 Palpitations: Secondary | ICD-10-CM

## 2024-09-03 DIAGNOSIS — R55 Syncope and collapse: Secondary | ICD-10-CM | POA: Diagnosis not present

## 2024-09-03 LAB — BASIC METABOLIC PANEL WITH GFR
BUN/Creatinine Ratio: 15 (ref 9–20)
BUN: 21 mg/dL (ref 6–24)
CO2: 25 mmol/L (ref 20–29)
Calcium: 10 mg/dL (ref 8.7–10.2)
Chloride: 99 mmol/L (ref 96–106)
Creatinine, Ser: 1.4 mg/dL — ABNORMAL HIGH (ref 0.76–1.27)
Glucose: 108 mg/dL — ABNORMAL HIGH (ref 70–99)
Potassium: 4.6 mmol/L (ref 3.5–5.2)
Sodium: 138 mmol/L (ref 134–144)
eGFR: 60 mL/min/1.73

## 2024-09-03 MED ORDER — LISINOPRIL 5 MG PO TABS: 5.0000 mg | ORAL_TABLET | Freq: Every day | ORAL | 3 refills | Status: AC

## 2024-09-03 NOTE — Patient Instructions (Signed)
 Medication Instructions:  No medication changes today. *If you need a refill on your cardiac medications before your next appointment, please call your pharmacy*  Lab Work: BMET today If you have labs (blood work) drawn today and your tests are completely normal, you will receive your results only by: MyChart Message (if you have MyChart) OR A paper copy in the mail If you have any lab test that is abnormal or we need to change your treatment, we will call you to review the results.  Testing/Procedures: No testing ordered today  Follow-Up: At [facility], you and your health needs are our priority.  As part of our continuing mission to provide you with exceptional heart care, our providers are all part of one team.  This team includes your primary Cardiologist (physician) and Advanced Practice Providers or APPs (Physician Assistants and Nurse Practitioners) who all work together to provide you with the care you need, when you need it.  Your next appointment:   12 month(s) with General Cardiology EP as needed   We recommend signing up for the patient portal called MyChart.  Sign up information is provided on this After Visit Summary.  MyChart is used to connect with patients for Virtual Visits (Telemedicine).  Patients are able to view lab/test results, encounter notes, upcoming appointments, etc.  Non-urgent messages can be sent to your provider as well.   To learn more about what you can do with MyChart, go to [email or web address].

## 2024-09-05 ENCOUNTER — Ambulatory Visit: Payer: Self-pay | Admitting: Student
